# Patient Record
Sex: Female | Born: 1980 | Race: Black or African American | Hispanic: No | Marital: Single | Smoking: Light tobacco smoker
Health system: Southern US, Community
[De-identification: ages and names within clinical notes are randomized; demographics above are authoritative.]

## PROBLEM LIST (undated history)

## (undated) DIAGNOSIS — R42 Dizziness and giddiness: Secondary | ICD-10-CM

## (undated) DIAGNOSIS — F4310 Post-traumatic stress disorder, unspecified: Secondary | ICD-10-CM

## (undated) DIAGNOSIS — I959 Hypotension, unspecified: Secondary | ICD-10-CM

## (undated) DIAGNOSIS — F329 Major depressive disorder, single episode, unspecified: Secondary | ICD-10-CM

## (undated) DIAGNOSIS — F32A Depression, unspecified: Secondary | ICD-10-CM

## (undated) DIAGNOSIS — F419 Anxiety disorder, unspecified: Secondary | ICD-10-CM

## (undated) HISTORY — DX: Major depressive disorder, single episode, unspecified: F32.9

## (undated) HISTORY — DX: Post-traumatic stress disorder, unspecified: F43.10

## (undated) HISTORY — DX: Depression, unspecified: F32.A

## (undated) HISTORY — PX: CARPAL TUNNEL RELEASE: SHX101

## (undated) HISTORY — PX: CHOLECYSTECTOMY: SHX55

## (undated) HISTORY — DX: Anxiety disorder, unspecified: F41.9

## (undated) HISTORY — PX: UTERINE FIBROID SURGERY: SHX826

## (undated) HISTORY — DX: Hypotension, unspecified: I95.9

## (undated) HISTORY — DX: Dizziness and giddiness: R42

---

## 2016-05-06 DIAGNOSIS — G8929 Other chronic pain: Secondary | ICD-10-CM | POA: Diagnosis not present

## 2016-05-06 DIAGNOSIS — Z131 Encounter for screening for diabetes mellitus: Secondary | ICD-10-CM | POA: Diagnosis not present

## 2016-05-06 DIAGNOSIS — Z1389 Encounter for screening for other disorder: Secondary | ICD-10-CM | POA: Diagnosis not present

## 2016-05-06 DIAGNOSIS — Z0001 Encounter for general adult medical examination with abnormal findings: Secondary | ICD-10-CM | POA: Diagnosis not present

## 2016-05-06 DIAGNOSIS — R1013 Epigastric pain: Secondary | ICD-10-CM | POA: Diagnosis not present

## 2016-05-06 DIAGNOSIS — M5090 Cervical disc disorder, unspecified, unspecified cervical region: Secondary | ICD-10-CM | POA: Diagnosis not present

## 2016-05-06 DIAGNOSIS — R42 Dizziness and giddiness: Secondary | ICD-10-CM | POA: Diagnosis not present

## 2016-05-06 DIAGNOSIS — Z13220 Encounter for screening for lipoid disorders: Secondary | ICD-10-CM | POA: Diagnosis not present

## 2016-05-06 DIAGNOSIS — Z130 Encounter for screening for diseases of the blood and blood-forming organs and certain disorders involving the immune mechanism: Secondary | ICD-10-CM | POA: Diagnosis not present

## 2016-05-06 DIAGNOSIS — G5603 Carpal tunnel syndrome, bilateral upper limbs: Secondary | ICD-10-CM | POA: Diagnosis not present

## 2016-05-11 DIAGNOSIS — R42 Dizziness and giddiness: Secondary | ICD-10-CM | POA: Diagnosis not present

## 2016-05-11 DIAGNOSIS — Z0001 Encounter for general adult medical examination with abnormal findings: Secondary | ICD-10-CM | POA: Diagnosis not present

## 2016-05-11 DIAGNOSIS — Z1389 Encounter for screening for other disorder: Secondary | ICD-10-CM | POA: Diagnosis not present

## 2016-05-11 DIAGNOSIS — Z131 Encounter for screening for diabetes mellitus: Secondary | ICD-10-CM | POA: Diagnosis not present

## 2016-05-11 DIAGNOSIS — Z130 Encounter for screening for diseases of the blood and blood-forming organs and certain disorders involving the immune mechanism: Secondary | ICD-10-CM | POA: Diagnosis not present

## 2016-05-11 DIAGNOSIS — Z13220 Encounter for screening for lipoid disorders: Secondary | ICD-10-CM | POA: Diagnosis not present

## 2016-05-13 DIAGNOSIS — R51 Headache: Secondary | ICD-10-CM | POA: Diagnosis not present

## 2016-05-13 DIAGNOSIS — R42 Dizziness and giddiness: Secondary | ICD-10-CM | POA: Diagnosis not present

## 2016-05-13 DIAGNOSIS — R2681 Unsteadiness on feet: Secondary | ICD-10-CM | POA: Diagnosis not present

## 2016-05-13 DIAGNOSIS — Z0110 Encounter for examination of ears and hearing without abnormal findings: Secondary | ICD-10-CM | POA: Diagnosis not present

## 2016-05-18 DIAGNOSIS — M5090 Cervical disc disorder, unspecified, unspecified cervical region: Secondary | ICD-10-CM | POA: Diagnosis not present

## 2016-05-18 DIAGNOSIS — G5603 Carpal tunnel syndrome, bilateral upper limbs: Secondary | ICD-10-CM | POA: Diagnosis not present

## 2016-05-18 DIAGNOSIS — G8929 Other chronic pain: Secondary | ICD-10-CM | POA: Diagnosis not present

## 2016-05-18 DIAGNOSIS — R42 Dizziness and giddiness: Secondary | ICD-10-CM | POA: Diagnosis not present

## 2016-05-18 DIAGNOSIS — R739 Hyperglycemia, unspecified: Secondary | ICD-10-CM | POA: Diagnosis not present

## 2016-05-18 DIAGNOSIS — R1013 Epigastric pain: Secondary | ICD-10-CM | POA: Diagnosis not present

## 2016-05-25 DIAGNOSIS — Z1389 Encounter for screening for other disorder: Secondary | ICD-10-CM | POA: Diagnosis not present

## 2016-05-25 DIAGNOSIS — M5090 Cervical disc disorder, unspecified, unspecified cervical region: Secondary | ICD-10-CM | POA: Diagnosis not present

## 2016-05-25 DIAGNOSIS — G5603 Carpal tunnel syndrome, bilateral upper limbs: Secondary | ICD-10-CM | POA: Diagnosis not present

## 2016-05-25 DIAGNOSIS — R739 Hyperglycemia, unspecified: Secondary | ICD-10-CM | POA: Diagnosis not present

## 2016-05-25 DIAGNOSIS — R1013 Epigastric pain: Secondary | ICD-10-CM | POA: Diagnosis not present

## 2016-05-25 DIAGNOSIS — G8929 Other chronic pain: Secondary | ICD-10-CM | POA: Diagnosis not present

## 2016-05-25 DIAGNOSIS — R42 Dizziness and giddiness: Secondary | ICD-10-CM | POA: Diagnosis not present

## 2016-06-01 DIAGNOSIS — R42 Dizziness and giddiness: Secondary | ICD-10-CM | POA: Diagnosis not present

## 2016-06-01 DIAGNOSIS — G5603 Carpal tunnel syndrome, bilateral upper limbs: Secondary | ICD-10-CM | POA: Diagnosis not present

## 2016-06-01 DIAGNOSIS — M5090 Cervical disc disorder, unspecified, unspecified cervical region: Secondary | ICD-10-CM | POA: Diagnosis not present

## 2016-06-01 DIAGNOSIS — R1013 Epigastric pain: Secondary | ICD-10-CM | POA: Diagnosis not present

## 2016-06-01 DIAGNOSIS — R739 Hyperglycemia, unspecified: Secondary | ICD-10-CM | POA: Diagnosis not present

## 2016-06-01 DIAGNOSIS — G8929 Other chronic pain: Secondary | ICD-10-CM | POA: Diagnosis not present

## 2016-07-21 DIAGNOSIS — R2681 Unsteadiness on feet: Secondary | ICD-10-CM | POA: Diagnosis not present

## 2016-07-21 DIAGNOSIS — R42 Dizziness and giddiness: Secondary | ICD-10-CM | POA: Diagnosis not present

## 2016-07-29 DIAGNOSIS — H8111 Benign paroxysmal vertigo, right ear: Secondary | ICD-10-CM | POA: Diagnosis not present

## 2016-08-13 DIAGNOSIS — R031 Nonspecific low blood-pressure reading: Secondary | ICD-10-CM | POA: Diagnosis not present

## 2016-08-13 DIAGNOSIS — B070 Plantar wart: Secondary | ICD-10-CM | POA: Diagnosis not present

## 2016-08-13 DIAGNOSIS — Z6824 Body mass index (BMI) 24.0-24.9, adult: Secondary | ICD-10-CM | POA: Diagnosis not present

## 2016-08-13 DIAGNOSIS — M79672 Pain in left foot: Secondary | ICD-10-CM | POA: Diagnosis not present

## 2016-08-27 DIAGNOSIS — M79672 Pain in left foot: Secondary | ICD-10-CM | POA: Diagnosis not present

## 2016-08-27 DIAGNOSIS — B078 Other viral warts: Secondary | ICD-10-CM | POA: Diagnosis not present

## 2016-09-10 DIAGNOSIS — B078 Other viral warts: Secondary | ICD-10-CM | POA: Diagnosis not present

## 2016-09-24 DIAGNOSIS — B078 Other viral warts: Secondary | ICD-10-CM | POA: Diagnosis not present

## 2016-09-26 DIAGNOSIS — S01511A Laceration without foreign body of lip, initial encounter: Secondary | ICD-10-CM | POA: Diagnosis not present

## 2016-09-26 DIAGNOSIS — Y999 Unspecified external cause status: Secondary | ICD-10-CM | POA: Diagnosis not present

## 2016-09-26 DIAGNOSIS — R55 Syncope and collapse: Secondary | ICD-10-CM | POA: Diagnosis not present

## 2016-09-26 DIAGNOSIS — S40011A Contusion of right shoulder, initial encounter: Secondary | ICD-10-CM | POA: Diagnosis not present

## 2016-09-30 DIAGNOSIS — R55 Syncope and collapse: Secondary | ICD-10-CM | POA: Diagnosis not present

## 2016-09-30 DIAGNOSIS — Z6824 Body mass index (BMI) 24.0-24.9, adult: Secondary | ICD-10-CM | POA: Diagnosis not present

## 2016-10-08 DIAGNOSIS — B078 Other viral warts: Secondary | ICD-10-CM | POA: Diagnosis not present

## 2016-10-14 DIAGNOSIS — N949 Unspecified condition associated with female genital organs and menstrual cycle: Secondary | ICD-10-CM | POA: Diagnosis not present

## 2016-10-14 DIAGNOSIS — M5090 Cervical disc disorder, unspecified, unspecified cervical region: Secondary | ICD-10-CM | POA: Diagnosis not present

## 2016-10-14 DIAGNOSIS — R739 Hyperglycemia, unspecified: Secondary | ICD-10-CM | POA: Diagnosis not present

## 2016-10-14 DIAGNOSIS — N898 Other specified noninflammatory disorders of vagina: Secondary | ICD-10-CM | POA: Diagnosis not present

## 2016-10-14 DIAGNOSIS — Z7251 High risk heterosexual behavior: Secondary | ICD-10-CM | POA: Diagnosis not present

## 2016-10-14 DIAGNOSIS — Z6824 Body mass index (BMI) 24.0-24.9, adult: Secondary | ICD-10-CM | POA: Diagnosis not present

## 2016-10-14 DIAGNOSIS — R42 Dizziness and giddiness: Secondary | ICD-10-CM | POA: Diagnosis not present

## 2016-10-14 DIAGNOSIS — G5603 Carpal tunnel syndrome, bilateral upper limbs: Secondary | ICD-10-CM | POA: Diagnosis not present

## 2016-10-14 DIAGNOSIS — L298 Other pruritus: Secondary | ICD-10-CM | POA: Diagnosis not present

## 2016-10-14 DIAGNOSIS — M545 Low back pain: Secondary | ICD-10-CM | POA: Diagnosis not present

## 2016-10-22 DIAGNOSIS — B078 Other viral warts: Secondary | ICD-10-CM | POA: Diagnosis not present

## 2016-11-05 DIAGNOSIS — B078 Other viral warts: Secondary | ICD-10-CM | POA: Diagnosis not present

## 2016-11-25 DIAGNOSIS — R55 Syncope and collapse: Secondary | ICD-10-CM | POA: Diagnosis not present

## 2016-11-27 DIAGNOSIS — B078 Other viral warts: Secondary | ICD-10-CM | POA: Diagnosis not present

## 2017-07-08 DIAGNOSIS — L309 Dermatitis, unspecified: Secondary | ICD-10-CM | POA: Diagnosis not present

## 2017-07-08 DIAGNOSIS — F419 Anxiety disorder, unspecified: Secondary | ICD-10-CM | POA: Diagnosis not present

## 2017-07-08 DIAGNOSIS — R55 Syncope and collapse: Secondary | ICD-10-CM | POA: Diagnosis not present

## 2017-07-08 DIAGNOSIS — K219 Gastro-esophageal reflux disease without esophagitis: Secondary | ICD-10-CM | POA: Diagnosis not present

## 2017-07-08 DIAGNOSIS — T753XXA Motion sickness, initial encounter: Secondary | ICD-10-CM | POA: Diagnosis not present

## 2017-07-08 DIAGNOSIS — R42 Dizziness and giddiness: Secondary | ICD-10-CM | POA: Diagnosis not present

## 2017-07-08 DIAGNOSIS — F329 Major depressive disorder, single episode, unspecified: Secondary | ICD-10-CM | POA: Diagnosis not present

## 2017-07-08 DIAGNOSIS — L84 Corns and callosities: Secondary | ICD-10-CM | POA: Diagnosis not present

## 2017-07-08 DIAGNOSIS — Z8249 Family history of ischemic heart disease and other diseases of the circulatory system: Secondary | ICD-10-CM | POA: Diagnosis not present

## 2017-07-08 DIAGNOSIS — G4700 Insomnia, unspecified: Secondary | ICD-10-CM | POA: Diagnosis not present

## 2017-07-22 DIAGNOSIS — L84 Corns and callosities: Secondary | ICD-10-CM | POA: Diagnosis not present

## 2017-07-29 DIAGNOSIS — R42 Dizziness and giddiness: Secondary | ICD-10-CM | POA: Diagnosis not present

## 2017-07-29 DIAGNOSIS — H5509 Other forms of nystagmus: Secondary | ICD-10-CM | POA: Diagnosis not present

## 2017-08-10 DIAGNOSIS — L84 Corns and callosities: Secondary | ICD-10-CM | POA: Diagnosis not present

## 2017-08-10 DIAGNOSIS — F329 Major depressive disorder, single episode, unspecified: Secondary | ICD-10-CM | POA: Diagnosis not present

## 2017-08-10 DIAGNOSIS — Z0000 Encounter for general adult medical examination without abnormal findings: Secondary | ICD-10-CM | POA: Diagnosis not present

## 2017-08-10 DIAGNOSIS — F419 Anxiety disorder, unspecified: Secondary | ICD-10-CM | POA: Diagnosis not present

## 2017-08-10 DIAGNOSIS — L905 Scar conditions and fibrosis of skin: Secondary | ICD-10-CM | POA: Diagnosis not present

## 2017-08-12 DIAGNOSIS — Z79899 Other long term (current) drug therapy: Secondary | ICD-10-CM | POA: Diagnosis not present

## 2017-08-12 DIAGNOSIS — R55 Syncope and collapse: Secondary | ICD-10-CM | POA: Diagnosis not present

## 2017-08-13 DIAGNOSIS — R55 Syncope and collapse: Secondary | ICD-10-CM | POA: Diagnosis not present

## 2017-09-13 DIAGNOSIS — R55 Syncope and collapse: Secondary | ICD-10-CM | POA: Diagnosis not present

## 2017-09-13 DIAGNOSIS — Z9889 Other specified postprocedural states: Secondary | ICD-10-CM | POA: Diagnosis not present

## 2017-09-13 DIAGNOSIS — F418 Other specified anxiety disorders: Secondary | ICD-10-CM | POA: Diagnosis not present

## 2017-09-13 DIAGNOSIS — M79601 Pain in right arm: Secondary | ICD-10-CM | POA: Diagnosis not present

## 2017-09-13 DIAGNOSIS — F4310 Post-traumatic stress disorder, unspecified: Secondary | ICD-10-CM | POA: Diagnosis not present

## 2017-09-13 DIAGNOSIS — R202 Paresthesia of skin: Secondary | ICD-10-CM | POA: Diagnosis not present

## 2017-09-13 DIAGNOSIS — H8110 Benign paroxysmal vertigo, unspecified ear: Secondary | ICD-10-CM | POA: Diagnosis not present

## 2017-09-13 DIAGNOSIS — Z720 Tobacco use: Secondary | ICD-10-CM | POA: Diagnosis not present

## 2017-09-13 DIAGNOSIS — M79602 Pain in left arm: Secondary | ICD-10-CM | POA: Diagnosis not present

## 2017-09-20 DIAGNOSIS — R55 Syncope and collapse: Secondary | ICD-10-CM | POA: Diagnosis not present

## 2017-10-13 DIAGNOSIS — R202 Paresthesia of skin: Secondary | ICD-10-CM | POA: Diagnosis not present

## 2017-10-13 DIAGNOSIS — R55 Syncope and collapse: Secondary | ICD-10-CM | POA: Diagnosis not present

## 2017-10-18 DIAGNOSIS — R55 Syncope and collapse: Secondary | ICD-10-CM | POA: Diagnosis not present

## 2018-03-03 ENCOUNTER — Ambulatory Visit: Payer: Self-pay | Admitting: Family Medicine

## 2018-03-08 ENCOUNTER — Ambulatory Visit (INDEPENDENT_AMBULATORY_CARE_PROVIDER_SITE_OTHER): Payer: Medicare Other | Admitting: Family Medicine

## 2018-03-08 ENCOUNTER — Encounter: Payer: Self-pay | Admitting: Family Medicine

## 2018-03-08 ENCOUNTER — Other Ambulatory Visit: Payer: Self-pay

## 2018-03-08 VITALS — BP 131/66 | HR 79 | Temp 98.0°F | Resp 17 | Ht 64.0 in | Wt 155.8 lb

## 2018-03-08 DIAGNOSIS — Z0001 Encounter for general adult medical examination with abnormal findings: Secondary | ICD-10-CM | POA: Diagnosis not present

## 2018-03-08 DIAGNOSIS — Z13220 Encounter for screening for lipoid disorders: Secondary | ICD-10-CM

## 2018-03-08 DIAGNOSIS — Z7689 Persons encountering health services in other specified circumstances: Secondary | ICD-10-CM

## 2018-03-08 DIAGNOSIS — R7989 Other specified abnormal findings of blood chemistry: Secondary | ICD-10-CM | POA: Diagnosis not present

## 2018-03-08 DIAGNOSIS — Z1329 Encounter for screening for other suspected endocrine disorder: Secondary | ICD-10-CM | POA: Diagnosis not present

## 2018-03-08 DIAGNOSIS — G4730 Sleep apnea, unspecified: Secondary | ICD-10-CM

## 2018-03-08 DIAGNOSIS — Z87898 Personal history of other specified conditions: Secondary | ICD-10-CM

## 2018-03-08 DIAGNOSIS — L905 Scar conditions and fibrosis of skin: Secondary | ICD-10-CM

## 2018-03-08 DIAGNOSIS — Z131 Encounter for screening for diabetes mellitus: Secondary | ICD-10-CM | POA: Diagnosis not present

## 2018-03-08 DIAGNOSIS — Z130 Encounter for screening for diseases of the blood and blood-forming organs and certain disorders involving the immune mechanism: Secondary | ICD-10-CM

## 2018-03-08 MED ORDER — TRIAMCINOLONE ACETONIDE 0.1 % EX CREA: 1.0000 "application " | TOPICAL_CREAM | Freq: Two times a day (BID) | CUTANEOUS | 1 refills | Status: DC

## 2018-03-08 MED ORDER — MELOXICAM 15 MG PO TABS: 15.0000 mg | ORAL_TABLET | Freq: Every day | ORAL | 1 refills | Status: DC

## 2018-03-08 NOTE — Patient Instructions (Addendum)
Thank you for choosing Primary Care at [facility] to be your medical home!    Diana Townsend was seen by Joaquin Courts, FNP today.   Diana Townsend's primary care provider is Bing Neighbors, FNP.   For the best care possible, you should try to see Joaquin Courts, FNP-C whenever you come to the clinic.   We look forward to seeing you again soon!  If you have any questions about your visit today, please call us at [phone] or feel free to reach your primary care provider via MyChart.       Sleep Studies A sleep study (polysomnogram) is a series of tests done while you are sleeping. It can show how well you sleep. This can help your health care provider diagnose a sleep disorder and show how severe your sleep disorder is. A sleep study may lead to treatment that will help you sleep better and prevent other medical problems caused by poor sleep. If you have a sleep disorder, you may also be at risk for:  Sleep-related accidents.  High blood pressure.  Heart disease.  Stroke.  Other medical conditions.  Sleep disorders are common. Your health care provider may suspect a sleep disorder if you:  Have loud snoring most nights.  Have brief periods when you stop breathing at night.  Feel sleepy on most days.  Fall asleep suddenly during the day.  Have trouble falling asleep or staying asleep.  Feel like you need to move your legs when trying to fall asleep.  Have dreams that seem very real shortly after falling asleep.  Feel like you cannot move when you first wake up.  Which tests will I need to have? Most sleep studies last all night and include these tests:  Recordings of your brain activity.  Recordings of your eye movements.  Recording of your heart rate and rhythm.  Blood pressure readings.  Readings of the amount of oxygen in your blood.  Measurements of your chest and belly movement as you breathe during sleep.  If you have signs of the sleep  disorder called sleep apnea during your test, you may get a mask to wear for the second half of the night.  The mask provides continuous positive airway pressure (CPAP). This may improve sleep apnea significantly.  You will then have all tests done again with the mask in place to see if your measurements and recordings change.  How are sleep studies done? Most sleep studies are done over one full night of sleep.  You will arrive at the study center in the evening and can go home in the morning.  Bring your pajamas and toothbrush.  Do not have caffeine on the day of your sleep study.  Your health care provider will let you know if you need to stop taking any of your regular medicines before the test.  To do the tests included in a polysomnogram, you will have:  Round, sticky patches with sensors attached to recording wires (electrodes) placed on your scalp, face, chest, and limbs.  Wires from all the electrodes and sensors run from your bed to a computer. The wires can be taken off and put back on if you need to get out of bed to go to the bathroom.  A sensor placed over your nose to measure airflow.  A finger clip put on one finger to measure your blood oxygen level.  A belt around your belly and a belt around your chest to measure breathing movements.  Where are sleep studies done? Sleep studies are done at sleep centers. A sleep center may be inside a hospital, office, or clinic. The room where you have the study may look like a hospital room or a hotel room. The health care providers doing the study may come in and out of the room during the study. Most of the time, they will be in another room monitoring your test. How is information from sleep studies helpful? A polysomnogram can be used along with your medical history and a physical exam to diagnose conditions, such as:  Sleep apnea.  Restless legs syndrome.  Sleep-related seizure disorders.  Sleep-related movement  disorders.  A medical doctor who specializes in sleep will evaluate your sleep study. The specialist will share the results with your primary health care provider. Treatments based on your sleep study may include:  Improving your sleep habits (sleep hygiene).  Wearing a CPAP mask.  Wearing an oral device at night to improve breathing and reduce snoring.  Taking medicine for: ? Restless legs syndrome. ? Sleep-related seizure disorder. ? Sleep-related movement disorder.  This information is not intended to replace advice given to you by your health care provider. Make sure you discuss any questions you have with your health care provider. Document Released: 10/25/2002 Document Revised: 12/15/2015 Document Reviewed: 06/26/2013 Elsevier Interactive Patient Education  [employer].

## 2018-03-08 NOTE — Progress Notes (Signed)
Diana Townsend, is a 37 y.o. female  ZOX:[id number]  [id number]:[id number]  DOB - March 14, 1981  CC:  Chief Complaint  Patient presents with  . Establish Care  . Referral    needs sleep study done & referral to dermatology(irritation of scar)       HPI: Diana Townsend is a 37 y.o. female is here today to establish care.   Aissata Failla does not have a problem list on file.    Today's visit:  Patient presents today to establish care. She also reports that she is requesting a sleep study. She has had multiple episodes of syncopal-like events in which she has passed out for no known reason.  She has undergone a comprehensive cardiovascular work-up which was negative of any findings related to her passing out.  She had a echo as recent as 02/17/2018 which showed a normal ejection fraction and normal functioning of the heart. She reports no prior history of obstructive sleep apnea however reports daytime sleepiness and she has been told before that she has stopped breathing during sleep.  She is on light smoker and has a current BMI of Body mass index is 26.74 kg/m.  She has had some elevations in blood pressure although no diagnosis of hypertension.  Overdue health maintenance includes: Pap, Tdap, Influenza She reports no routine physical activity and does not adhere to any restrictive dietary measures.   Patient denies new headaches, chest pain, abdominal pain, nausea, new weakness, numbness or tingling, SOB, edema, or worrisome cough..    Current medications:No current outpatient medications on file.   Pertinent family medical history: family history includes Anemia in her sister; COPD in her father; Diabetes in her father, maternal aunt, and mother; Heart disease in her maternal grandfather, mother, and paternal grandmother; Heart failure in her father; Hypertension in her father; Kidney disease in her father; Seizures in her father and sister.   Allergies not on file  Social History    Socioeconomic History  . Marital status: Single    Spouse name: Not on file  . Number of children: Not on file  . Years of education: Not on file  . Highest education level: Not on file  Occupational History  . Not on file  Social Needs  . Financial resource strain: Not on file  . Food insecurity:    Worry: Not on file    Inability: Not on file  . Transportation needs:    Medical: Not on file    Non-medical: Not on file  Tobacco Use  . Smoking status: Light Tobacco Smoker    Types: Cigars  . Smokeless tobacco: Never Used  Substance and Sexual Activity  . Alcohol use: Not Currently  . Drug use: Never  . Sexual activity: Not Currently  Lifestyle  . Physical activity:    Days per week: Not on file    Minutes per session: Not on file  . Stress: Not on file  Relationships  . Social connections:    Talks on phone: Not on file    Gets together: Not on file    Attends religious service: Not on file    Active member of club or organization: Not on file    Attends meetings of clubs or organizations: Not on file    Relationship status: Not on file  . Intimate partner violence:    Fear of current or ex partner: Not on file    Emotionally abused: Not on file    Physically abused: Not on file  Forced sexual activity: Not on file  Other Topics Concern  . Not on file  Social History Narrative  . Not on file    Review of Systems: Constitutional: Negative for fever, chills, diaphoresis, activity change, appetite change and fatigue. HENT: Negative for ear pain, nosebleeds, congestion, facial swelling, rhinorrhea, neck pain, neck stiffness and ear discharge.  Eyes: Negative for pain, discharge, redness, itching and visual disturbance. Respiratory: Negative for cough, choking, chest tightness, shortness of breath, wheezing and stridor.  Cardiovascular: Negative for chest pain, palpitations and leg swelling. Musculoskeletal: Negative for back pain, joint swelling, arthralgia and  gait problem. Neurological: Negative for dizziness, tremors, seizures, syncope, facial asymmetry, speech difficulty, weakness, light-headedness, numbness and headaches.  Psychiatric/Behavioral: Negative for hallucinations, behavioral problems, confusion, dysphoric mood, decreased concentration and agitation.    Objective:   Vitals:   03/08/18 1055  BP: 131/66  Pulse: 79  Resp: 17  Temp: 98 F (36.7 C)  SpO2: 97%    BP Readings from Last 3 Encounters:  03/08/18 131/66    Filed Weights   03/08/18 1055  Weight: 155 lb 12.8 oz (70.7 kg)      Physical Exam: Constitutional: Patient appears well-developed and well-nourished. No distress. HENT: Normocephalic, atraumatic, External right and left ear normal.  Eyes: Conjunctivae and EOM are normal. PERRLA, no scleral icterus. Neck: Normal ROM. Neck supple. No JVD. No tracheal deviation. No thyromegaly. CVS: RRR, S1/S2 +, no murmurs, no gallops, no carotid bruit.  Pulmonary: Effort and breath sounds normal, no stridor, rhonchi, wheezes, rales.  Abdominal: Soft. BS +, no distension, tenderness, rebound or guarding.  Musculoskeletal: Normal range of motion. No edema and no tenderness.  Neuro: Alert. Normal muscle tone coordination. Normal gait. BUE and BLE strength 5/5. Bilateral hand grips symmetrical.No cranial nerve deficit. Skin: Skin is warm and dry. No rash noted. Not diaphoretic. No erythema. No pallor. Psychiatric: Normal mood and affect. Behavior, judgment, thought content normal.     Assessment and plan:  1. Encounter to establish care 2. Screening for lipid disorders - Lipid panel 3. Low vitamin D level - Vitamin D, 25-hydroxy  4. Screening for diabetes mellitus - Comprehensive metabolic panel - Hemoglobin A1c  5. Screening, lipid -fasting lipid panel pending  6. Thyroid disorder screen - Thyroid Panel With TSH  7. Screening for deficiency anemia - CBC with Differential  8. Skin scarring/fibrosis, will trial  triamcinolone cream while patient is worked into dermatology to reduce chronic skin picking and itching. - Ambulatory referral to Dermatology  9. Sleep apnea, unspecified type -Epworth sleepiness scale was within normal range.  Given patient's history of multiple syncopal episodes and her reported history of apneic episodes during nighttime sleep, patient may likely benefit from a sleep study to rule out OSA. - Split night study; Future  10. Hx of syncope, with a normal cardiac work-up. Order placed split night study; Future  Meds ordered this encounter  Medications  . triamcinolone cream (KENALOG) 0.1 %    Sig: Apply 1 application topically 2 (two) times daily.    Dispense:  454 g    Refill:  1  . meloxicam (MOBIC) 15 MG tablet    Sig: Take 1 tablet (15 mg total) by mouth daily.    Dispense:  30 tablet    Refill:  1    Orders Placed This Encounter  Procedures  . Lipid panel    Order Specific Question:   Has the patient fasted?    Answer:   No  . Comprehensive  metabolic panel    Order Specific Question:   Has the patient fasted?    Answer:   No  . Vitamin D, 25-hydroxy  . Hemoglobin A1c  . CBC with Differential  . Thyroid Panel With TSH  . Ambulatory referral to Dermatology    Referral Priority:   Routine    Referral Type:   Consultation    Referral Reason:   Specialty Services Required    Requested Specialty:   Dermatology    Number of Visits Requested:   1  . Split night study    Standing Status:   Future    Standing Expiration Date:   03/09/2019    Order Specific Question:   Where should this test be performed:    Answer:   [facility]   Return in about 2 months (around 05/13/2018), or PAP .   The patient was given clear instructions to go to ER or return to medical center if symptoms don't improve, worsen or new problems develop. The patient verbalized understanding. The patient was advised  to call and obtain lab results if they haven't heard  anything from out office within 7-10 business days.  Joaquin Courts, FNP Primary Care at [facility] [address] [place] [address] [phone]: [phone]    This note has been created with Dragon speech recognition software and Paediatric nurse. Any transcriptional errors are unintentional.

## 2018-03-09 ENCOUNTER — Telehealth: Payer: Self-pay | Admitting: Family Medicine

## 2018-03-09 DIAGNOSIS — E875 Hyperkalemia: Secondary | ICD-10-CM

## 2018-03-09 LAB — CBC WITH DIFFERENTIAL/PLATELET
BASOS ABS: 0 10*3/uL (ref 0.0–0.2)
Basos: 1 %
EOS (ABSOLUTE): 0.1 10*3/uL (ref 0.0–0.4)
Eos: 2 %
Hematocrit: 40.9 % (ref 34.0–46.6)
Hemoglobin: 13.8 g/dL (ref 11.1–15.9)
IMMATURE GRANS (ABS): 0 10*3/uL (ref 0.0–0.1)
Immature Granulocytes: 0 %
LYMPHS: 32 %
Lymphocytes Absolute: 2 10*3/uL (ref 0.7–3.1)
MCH: 26.7 pg (ref 26.6–33.0)
MCHC: 33.7 g/dL (ref 31.5–35.7)
MCV: 79 fL (ref 79–97)
Monocytes Absolute: 0.4 10*3/uL (ref 0.1–0.9)
Monocytes: 6 %
NEUTROS ABS: 3.7 10*3/uL (ref 1.4–7.0)
Neutrophils: 59 %
PLATELETS: 344 10*3/uL (ref 150–450)
RBC: 5.17 x10E6/uL (ref 3.77–5.28)
RDW: 13.8 % (ref 12.3–15.4)
WBC: 6.3 10*3/uL (ref 3.4–10.8)

## 2018-03-09 LAB — COMPREHENSIVE METABOLIC PANEL
ALBUMIN: 4.5 g/dL (ref 3.5–5.5)
ALK PHOS: 86 IU/L (ref 39–117)
ALT: 11 IU/L (ref 0–32)
AST: 29 IU/L (ref 0–40)
Albumin/Globulin Ratio: 1.7 (ref 1.2–2.2)
BILIRUBIN TOTAL: 0.5 mg/dL (ref 0.0–1.2)
BUN / CREAT RATIO: 14 (ref 9–23)
BUN: 10 mg/dL (ref 6–20)
CHLORIDE: 104 mmol/L (ref 96–106)
CO2: 17 mmol/L — ABNORMAL LOW (ref 20–29)
Calcium: 9.1 mg/dL (ref 8.7–10.2)
Creatinine, Ser: 0.72 mg/dL (ref 0.57–1.00)
GFR calc Af Amer: 122 mL/min/{1.73_m2} (ref >59)
GFR calc non Af Amer: 106 mL/min/{1.73_m2} (ref >59)
GLOBULIN, TOTAL: 2.6 g/dL (ref 1.5–4.5)
Glucose: 80 mg/dL (ref 65–99)
POTASSIUM: 5.7 mmol/L — AB (ref 3.5–5.2)
SODIUM: 143 mmol/L (ref 134–144)
Total Protein: 7.1 g/dL (ref 6.0–8.5)

## 2018-03-09 LAB — LIPID PANEL
CHOLESTEROL TOTAL: 248 mg/dL — AB (ref 100–199)
Chol/HDL Ratio: 4.7 ratio — ABNORMAL HIGH (ref 0.0–4.4)
HDL: 53 mg/dL (ref >39)
LDL Calculated: 169 mg/dL — ABNORMAL HIGH (ref 0–99)
Triglycerides: 130 mg/dL (ref 0–149)
VLDL CHOLESTEROL CAL: 26 mg/dL (ref 5–40)

## 2018-03-09 LAB — THYROID PANEL WITH TSH
FREE THYROXINE INDEX: 2.5 (ref 1.2–4.9)
T3 Uptake Ratio: 28 % (ref 24–39)
T4 TOTAL: 9.1 ug/dL (ref 4.5–12.0)
TSH: 1.49 u[IU]/mL (ref 0.450–4.500)

## 2018-03-09 LAB — HEMOGLOBIN A1C
Est. average glucose Bld gHb Est-mCnc: 111 mg/dL
Hgb A1c MFr Bld: 5.5 % (ref 4.8–5.6)

## 2018-03-09 LAB — VITAMIN D 25 HYDROXY (VIT D DEFICIENCY, FRACTURES): VIT D 25 HYDROXY: 24.4 ng/mL — AB (ref 30.0–100.0)

## 2018-03-09 MED ORDER — VITAMIN D (ERGOCALCIFEROL) 1.25 MG (50000 UNIT) PO CAPS: 50000.0000 [IU] | ORAL_CAPSULE | ORAL | 1 refills | Status: DC

## 2018-03-09 NOTE — Telephone Encounter (Signed)
Contact patient to advise of the following:  -potassium level was elevated. Would like for her to return to have potassium level recheck. If level remains elevated I will need to start her on lasix. If she could come in Friday afternoon during the nurse schedule that would be great  -lipid panel is abnormal. I would recommend statin therapy given her history of syncope and family history of heart disease. I will start atorvastatin 20 mg if patient is in agreement with plan.  Joaquin Courts, FNP Primary Care at [facility] [address] [place] [address] [phone]: [phone]

## 2018-03-09 NOTE — Telephone Encounter (Signed)
Vitamin D level is low. I am sending over a prescription for once weekly, 50,000 units vitamin D replacement.

## 2018-03-10 ENCOUNTER — Encounter: Payer: Self-pay | Admitting: Family Medicine

## 2018-03-10 MED ORDER — ATORVASTATIN CALCIUM 20 MG PO TABS: 20.0000 mg | ORAL_TABLET | Freq: Every day | ORAL | 3 refills | Status: DC

## 2018-03-10 NOTE — Telephone Encounter (Signed)
Patient notified of lab results & recommendations. Is agreeable to starting statin.  Made lab appt for 3 pm on 03/11/18

## 2018-03-10 NOTE — Telephone Encounter (Signed)
Atorvastatin 20 mg sent to [facility]

## 2018-03-10 NOTE — Addendum Note (Signed)
Addended by: Bing Neighbors on: 03/10/2018 05:06 PM   Modules accepted: Orders

## 2018-03-11 ENCOUNTER — Other Ambulatory Visit: Payer: Medicare Other

## 2018-03-11 DIAGNOSIS — E875 Hyperkalemia: Secondary | ICD-10-CM

## 2018-03-12 LAB — POTASSIUM: Potassium: 4.5 mmol/L (ref 3.5–5.2)

## 2018-03-13 ENCOUNTER — Telehealth: Payer: Self-pay | Admitting: Family Medicine

## 2018-03-13 NOTE — Telephone Encounter (Signed)
Please notify patient that potassium is normal. No treatment indicated

## 2018-03-14 NOTE — Telephone Encounter (Signed)
Patient notified of lab results. Expressed understanding. Wants to know if there are certain foods that she needs to avoid to prevent potassium from being high again.

## 2018-05-05 ENCOUNTER — Ambulatory Visit (HOSPITAL_BASED_OUTPATIENT_CLINIC_OR_DEPARTMENT_OTHER): Payer: Medicare Other | Attending: Family Medicine | Admitting: Internal Medicine

## 2018-05-05 DIAGNOSIS — Z87898 Personal history of other specified conditions: Secondary | ICD-10-CM | POA: Diagnosis not present

## 2018-05-05 DIAGNOSIS — G4700 Insomnia, unspecified: Secondary | ICD-10-CM | POA: Insufficient documentation

## 2018-05-05 DIAGNOSIS — G4730 Sleep apnea, unspecified: Secondary | ICD-10-CM | POA: Insufficient documentation

## 2018-05-07 DIAGNOSIS — G4730 Sleep apnea, unspecified: Secondary | ICD-10-CM

## 2018-05-07 NOTE — Procedures (Signed)
    Patient Name: Diana Townsend, Diana Townsend Date: 05/05/2018 Gender: Female D.O.B: February 20, 1981 Age (years): 37 Referring Provider: Joaquin Courts FNP Height (inches): 64 Interpreting Physician: Jetty Duhamel MD, ABSM Weight (lbs): 155 RPSGT: Celene Kras BMI: 27 MRN: [id number] Neck Size: 13.00  CLINICAL INFORMATION Sleep Study Type: NPSG Indication for sleep study: OSA Epworth Sleepiness Score: 6  SLEEP STUDY TECHNIQUE As per the AASM Manual for the Scoring of Sleep and Associated Events v2.3 (April 2016) with a hypopnea requiring 4% desaturations.  The channels recorded and monitored were frontal, central and occipital EEG, electrooculogram (EOG), submentalis EMG (chin), nasal and oral airflow, thoracic and abdominal wall motion, anterior tibialis EMG, snore microphone, electrocardiogram, and pulse oximetry.  MEDICATIONS Medications self-administered by patient taken the night of the study : none reported  SLEEP ARCHITECTURE The study was initiated at 10:27:32 PM and ended at 4:32:49 AM.  Sleep onset time was 29.2 minutes and the sleep efficiency was 35.3%%. The total sleep time was 129.1 minutes.  Stage REM latency was N/A minutes.  The patient spent 7.0%% of the night in stage N1 sleep, 87.6%% in stage N2 sleep, 5.4%% in stage N3 and 0% in REM.  Alpha intrusion was absent.  Supine sleep was 16.27%.  RESPIRATORY PARAMETERS The overall apnea/hypopnea index (AHI) was 0.0 per hour. There were 0 total apneas, including 0 obstructive, 0 central and 0 mixed apneas. There were 0 hypopneas and 0 RERAs.  The AHI during Stage REM sleep was N/A per hour.  AHI while supine was 0.0 per hour.  The mean oxygen saturation was 97.6%. The minimum SpO2 during sleep was 96.0%.  snoring was noted during this study.  CARDIAC DATA The 2 lead EKG demonstrated sinus rhythm. The mean heart rate was 72.7 beats per minute. Other EKG findings include: None.  LEG MOVEMENT DATA The total  PLMS were 0 with a resulting PLMS index of 0.0. Associated arousal with leg movement index was 0.5 .  IMPRESSIONS - No significant obstructive sleep apnea occurred during this study (AHI = 0.0/h). - No significant central sleep apnea occurred during this study (CAI = 0.0/h). - The patient had minimal or no oxygen desaturation during the study (Min O2 = 96.0%) - No snoring was audible during this study. - No cardiac abnormalities were noted during this study. - Clinically significant periodic limb movements did not occur during sleep. No significant associated arousals. - Significant difficulty maintaining sleep. Total sleep time 129 minutes. Patient says this happens at home as well.  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 Recognize that limited sleep time might warrant restudy in the future, perhaps with a sleep-aid, if appropriate. - Patient reported that cough disturbs sleep at home. Consider potential factors, including reflux, that might cause cough at night. - Sleep hygiene should be reviewed to assess factors that may improve sleep quality. - Weight management and regular exercise should be initiated or continued if appropriate.  [Electronically signed] 05/07/2018 12:24 PM  Jetty Duhamel MD, ABSM Diplomate, American Board of Sleep Medicine   NPI: [id number]                         Jetty Duhamel Diplomate, American Board of Sleep Medicine  ELECTRONICALLY SIGNED ON:  05/07/2018, 12:19 PM [facility] PH: [phone]   FX: [phone] ACCREDITED BY THE AMERICAN ACADEMY OF SLEEP MEDICINE

## 2018-05-09 ENCOUNTER — Ambulatory Visit: Payer: Self-pay | Admitting: Family Medicine

## 2018-05-11 ENCOUNTER — Encounter: Payer: Self-pay | Admitting: Family Medicine

## 2018-05-11 NOTE — Progress Notes (Signed)
Mail letter. 

## 2018-05-19 ENCOUNTER — Ambulatory Visit (INDEPENDENT_AMBULATORY_CARE_PROVIDER_SITE_OTHER): Payer: Medicare Other | Admitting: Family Medicine

## 2018-05-19 ENCOUNTER — Encounter: Payer: Self-pay | Admitting: Family Medicine

## 2018-05-19 VITALS — BP 118/76 | HR 87 | Temp 97.4°F | Resp 17 | Ht 64.0 in | Wt 157.0 lb

## 2018-05-19 DIAGNOSIS — J018 Other acute sinusitis: Secondary | ICD-10-CM

## 2018-05-19 DIAGNOSIS — H66001 Acute suppurative otitis media without spontaneous rupture of ear drum, right ear: Secondary | ICD-10-CM | POA: Diagnosis not present

## 2018-05-19 DIAGNOSIS — E559 Vitamin D deficiency, unspecified: Secondary | ICD-10-CM | POA: Diagnosis not present

## 2018-05-19 MED ORDER — AZITHROMYCIN 250 MG PO TABS: ORAL_TABLET | ORAL | 0 refills | Status: DC

## 2018-05-19 MED ORDER — IPRATROPIUM BROMIDE 0.03 % NA SOLN: 2.0000 | Freq: Three times a day (TID) | NASAL | 0 refills | Status: DC

## 2018-05-19 MED ORDER — BENZONATATE 100 MG PO CAPS: 100.0000 mg | ORAL_CAPSULE | Freq: Three times a day (TID) | ORAL | 0 refills | Status: DC | PRN

## 2018-05-19 NOTE — Patient Instructions (Signed)
Otitis Media, Adult  Otitis media means that the middle ear is red and swollen (inflamed) and full of fluid. The condition usually goes away on its own. Follow these instructions at home:  Take over-the-counter and prescription medicines only as told by your doctor.  If you were prescribed an antibiotic medicine, take it as told by your doctor. Do not stop taking the antibiotic even if you start to feel better.  Keep all follow-up visits as told by your doctor. This is important. Contact a doctor if:  You have bleeding from your nose.  There is a lump on your neck.  You are not getting better in 5 days.  You feel worse instead of better. Get help right away if:  You have pain that is not helped with medicine.  You have swelling, redness, or pain around your ear.  You get a stiff neck.  You cannot move part of your face (paralyzed).  You notice that the bone behind your ear hurts when you touch it.  You get a very bad headache. Summary  Otitis media means that the middle ear is red, swollen, and full of fluid.  This condition usually goes away on its own. In some cases, treatment may be needed.  If you were prescribed an antibiotic medicine, take it as told by your doctor. This information is not intended to replace advice given to you by your health care provider. Make sure you discuss any questions you have with your health care provider. Document Released: 10/07/2007 Document Revised: 05/11/2016 Document Reviewed: 05/11/2016 Elsevier Interactive Patient Education  2019 [employer] Sinusitis, Adult Sinusitis is soreness and swelling (inflammation) of your sinuses. Sinuses are hollow spaces in the bones around your face. They are located:  Around your eyes.  In the middle of your forehead.  Behind your nose.  In your cheekbones. Your sinuses and nasal passages are lined with a fluid called mucus. Mucus drains out of your sinuses. Swelling can trap mucus in your  sinuses. This lets germs (bacteria, virus, or fungus) grow, which leads to infection. Most of the time, this condition is caused by a virus. What are the causes? This condition is caused by:  Allergies.  Asthma.  Germs.  Things that block your nose or sinuses.  Growths in the nose (nasal polyps).  Chemicals or irritants in the air.  Fungus (rare). What increases the risk? You are more likely to develop this condition if:  You have a weak body defense system (immune system).  You do a lot of swimming or diving.  You use nasal sprays too much.  You smoke. What are the signs or symptoms? The main symptoms of this condition are pain and a feeling of pressure around the sinuses. Other symptoms include:  Stuffy nose (congestion).  Runny nose (drainage).  Swelling and warmth in the sinuses.  Headache.  Toothache.  A cough that may get worse at night.  Mucus that collects in the throat or the back of the nose (postnasal drip).  Being unable to smell and taste.  Being very tired (fatigue).  A fever.  Sore throat.  Bad breath. How is this diagnosed? This condition is diagnosed based on:  Your symptoms.  Your medical history.  A physical exam.  Tests to find out if your condition is short-term (acute) or long-term (chronic). Your doctor may: ? Check your nose for growths (polyps). ? Check your sinuses using a tool that has a light (endoscope). ? Check for allergies or   germs. ? Do imaging tests, such as an MRI or CT scan. How is this treated? Treatment for this condition depends on the cause and whether it is short-term or long-term.  If caused by a virus, your symptoms should go away on their own within 10 days. You may be given medicines to relieve symptoms. They include: ? Medicines that shrink swollen tissue in the nose. ? Medicines that treat allergies (antihistamines). ? A spray that treats swelling of the nostrils. ? Rinses that help get rid of  thick mucus in your nose (nasal saline washes).  If caused by bacteria, your doctor may wait to see if you will get better without treatment. You may be given antibiotic medicine if you have: ? A very bad infection. ? A weak body defense system.  If caused by growths in the nose, you may need to have surgery. Follow these instructions at home: Medicines  Take, use, or apply over-the-counter and prescription medicines only as told by your doctor. These may include nasal sprays.  If you were prescribed an antibiotic medicine, take it as told by your doctor. Do not stop taking the antibiotic even if you start to feel better. Hydrate and humidify   Drink enough water to keep your pee (urine) pale yellow.  Use a cool mist humidifier to keep the humidity level in your home above 50%.  Breathe in steam for 10-15 minutes, 3-4 times a day, or as told by your doctor. You can do this in the bathroom while a hot shower is running.  Try not to spend time in cool or dry air. Rest  Rest as much as you can.  Sleep with your head raised (elevated).  Make sure you get enough sleep each night. General instructions   Put a warm, moist washcloth on your face 3-4 times a day, or as often as told by your doctor. This will help with discomfort.  Wash your hands often with soap and water. If there is no soap and water, use hand sanitizer.  Do not smoke. Avoid being around people who are smoking (secondhand smoke).  Keep all follow-up visits as told by your doctor. This is important. Contact a doctor if:  You have a fever.  Your symptoms get worse.  Your symptoms do not get better within 10 days. Get help right away if:  You have a very bad headache.  You cannot stop throwing up (vomiting).  You have very bad pain or swelling around your face or eyes.  You have trouble seeing.  You feel confused.  Your neck is stiff.  You have trouble breathing. Summary  Sinusitis is swelling of  your sinuses. Sinuses are hollow spaces in the bones around your face.  This condition is caused by tissues in your nose that become inflamed or swollen. This traps germs. These can lead to infection.  If you were prescribed an antibiotic medicine, take it as told by your doctor. Do not stop taking it even if you start to feel better.  Keep all follow-up visits as told by your doctor. This is important. This information is not intended to replace advice given to you by your health care provider. Make sure you discuss any questions you have with your health care provider. Document Released: 10/07/2007 Document Revised: 09/20/2017 Document Reviewed: 09/20/2017 Elsevier Interactive Patient Education  2019 [employer]  

## 2018-05-19 NOTE — Progress Notes (Signed)
Acute Office Visit  Subjective:    Patient ID: Diana Townsend, female    DOB: 02/11/1981, 38 y.o.   MRN: [id number]  Chief Complaint  Patient presents with  . URI    productive cough, postnasal drainage, nasal congestion, sneezing x 1 week. has been taking cough syrup & theraflu with some relief.  . Ear Fullness    B(R>L)    HPI  URI w/ear pain Patient reports greater than a 1 week history of cough which is occasionally productive, postnasal drainage, bilateral ear pain, nasal congestion, sneezing.  She reports that the cough is gradually getting worse.  She also had a brief period of a sore throat which is currently resolved.  She reports over the last couple days ear pressure has turned into ear pain right greater than left.  She has attempted relief with over-the-counter cough and cold preparations along with throat lozenges with mild relief of cough and congestion.  She denies any wheezing, shortness of breath, chest tightness, or fever. She is a chronic daily smoker.  Vitamin D deficiency  Last vitamin d 24.4.  She is down to 2 tablets and is requesting a refill today of vitamin D supplements.   Recheck in a vitamin D level today. Past Medical History:  Diagnosis Date  . Anxiety   . Depression   . Low blood pressure   . PTSD (post-traumatic stress disorder)   . Vertigo     Family History  Problem Relation Age of Onset  . Diabetes Mother   . Heart disease Mother   . COPD Father   . Diabetes Father   . Kidney disease Father   . Hypertension Father   . Heart failure Father   . Seizures Father   . Seizures Sister   . Anemia Sister   . Diabetes Maternal Aunt   . Heart disease Maternal Grandfather   . Heart disease Paternal Grandmother     Social History   Socioeconomic History  . Marital status: Single    Spouse name: Not on file  . Number of children: Not on file  . Years of education: Not on file  . Highest education level: Not on file  Occupational History  .  Not on file  Social Needs  . Financial resource strain: Not on file  . Food insecurity:    Worry: Not on file    Inability: Not on file  . Transportation needs:    Medical: Not on file    Non-medical: Not on file  Tobacco Use  . Smoking status: Light Tobacco Smoker    Types: Cigars  . Smokeless tobacco: Never Used  Substance and Sexual Activity  . Alcohol use: Not Currently  . Drug use: Never  . Sexual activity: Not Currently  Lifestyle  . Physical activity:    Days per week: Not on file    Minutes per session: Not on file  . Stress: Not on file  Relationships  . Social connections:    Talks on phone: Not on file    Gets together: Not on file    Attends religious service: Not on file    Active member of club or organization: Not on file    Attends meetings of clubs or organizations: Not on file    Relationship status: Not on file  . Intimate partner violence:    Fear of current or ex partner: Not on file    Emotionally abused: Not on file    Physically abused: Not on  file    Forced sexual activity: Not on file  Other Topics Concern  . Not on file  Social History Narrative  . Not on file    Outpatient Medications Prior to Visit  Medication Sig Dispense Refill  . atorvastatin (LIPITOR) 20 MG tablet Take 1 tablet (20 mg total) by mouth daily. 90 tablet 3  . meloxicam (MOBIC) 15 MG tablet Take 1 tablet (15 mg total) by mouth daily. 30 tablet 1  . triamcinolone cream (KENALOG) 0.1 % Apply 1 application topically 2 (two) times daily. 454 g 1  . Vitamin D, Ergocalciferol, (DRISDOL) 1.25 MG (50000 UT) CAPS capsule Take 1 capsule (50,000 Units total) by mouth every 7 (seven) days. 12 capsule 1   No facility-administered medications prior to visit.     No Known Allergies  ROS Pertinent negatives listed in HPI     Objective:    BP 118/76   Pulse 87   Temp (!) 97.4 F (36.3 C) (Oral)   Resp 17   Ht 5\' 4"  (1.626 m)   Wt 157 lb (71.2 kg)   LMP 05/04/2018 (Exact  Date)   SpO2 97%   BMI 26.95 kg/m   Physical Exam  Constitutional: She is oriented to person, place, and time. She appears well-developed and well-nourished.  HENT:  Right Ear: There is swelling and tenderness. Tympanic membrane is erythematous.  Left Ear: No tenderness. A middle ear effusion is present.  Nose: Mucosal edema and rhinorrhea present.  Mouth/Throat: Uvula is midline and oropharynx is clear and moist.  Cardiovascular: Normal rate, regular rhythm and normal heart sounds.  Pulmonary/Chest: Effort normal and breath sounds normal.  Musculoskeletal: Normal range of motion.  Neurological: She is alert and oriented to person, place, and time.  Skin: Skin is warm and dry.  Psychiatric: She has a normal mood and affect. Her behavior is normal. Judgment and thought content normal.    Wt Readings from Last 3 Encounters:  05/19/18 157 lb (71.2 kg)  05/06/18 155 lb (70.3 kg)  03/08/18 155 lb 12.8 oz (70.7 kg)    Health Maintenance Due  Topic Date Due  . HIV Screening  06/19/1993  . TETANUS/TDAP  06/19/1997  . PAP SMEAR-Modifier  06/20/1999    There are no preventive care reminders to display for this patient.   Lab Results  Component Value Date   TSH 1.490 03/08/2018   Lab Results  Component Value Date   WBC 6.3 03/08/2018   HGB 13.8 03/08/2018   HCT 40.9 03/08/2018   MCV 79 03/08/2018   PLT 344 03/08/2018   Lab Results  Component Value Date   NA 143 03/08/2018   K 4.5 03/11/2018   CO2 17 (L) 03/08/2018   GLUCOSE 80 03/08/2018   BUN 10 03/08/2018   CREATININE 0.72 03/08/2018   BILITOT 0.5 03/08/2018   ALKPHOS 86 03/08/2018   AST 29 03/08/2018   ALT 11 03/08/2018   PROT 7.1 03/08/2018   ALBUMIN 4.5 03/08/2018   CALCIUM 9.1 03/08/2018   Lab Results  Component Value Date   CHOL 248 (H) 03/08/2018   Lab Results  Component Value Date   HDL 53 03/08/2018   Lab Results  Component Value Date   LDLCALC 169 (H) 03/08/2018   Lab Results  Component  Value Date   TRIG 130 03/08/2018   Lab Results  Component Value Date   CHOLHDL 4.7 (H) 03/08/2018   Lab Results  Component Value Date   HGBA1C 5.5 03/08/2018  Assessment & Plan:  1. Vitamin D deficiency - Vitamin D, 25-hydroxy, repeating vitamin D level today.  If remains the same or still low I will continue vitamin D replacement for a period of an additional 3 to 6 months.  2. Non-recurrent acute suppurative otitis media of right ear without spontaneous rupture of tympanic membrane 3. Other acute sinusitis, recurrence not specified Start azithromycin pack complete as instructed. For nasal congestion I prescribed Atrovent nasal spray 3 times daily as needed For cough take benzonatate 100 to 200 mg up to 3 times daily as needed for cough. Recommended hydration with water and rest until symptoms resolve.   Meds ordered this encounter  Medications  . azithromycin (ZITHROMAX) 250 MG tablet    Sig: Take 2 tabs PO x 1 dose, then 1 tab PO QD x 4 days    Dispense:  6 tablet    Refill:  0  . ipratropium (ATROVENT) 0.03 % nasal spray    Sig: Place 2 sprays into both nostrils 3 (three) times daily.    Dispense:  30 mL    Refill:  0  . benzonatate (TESSALON) 100 MG capsule    Sig: Take 1-2 capsules (100-200 mg total) by mouth 3 (three) times daily as needed for cough.    Dispense:  40 capsule    Refill:  0     Joaquin CourtsKimberly Isobella Ascher, FNP

## 2018-05-20 ENCOUNTER — Encounter: Payer: Self-pay | Admitting: Family Medicine

## 2018-05-20 LAB — VITAMIN D 25 HYDROXY (VIT D DEFICIENCY, FRACTURES): Vit D, 25-Hydroxy: 42.1 ng/mL (ref 30.0–100.0)

## 2018-06-09 ENCOUNTER — Ambulatory Visit: Payer: Medicare Other | Admitting: Family Medicine

## 2018-06-22 ENCOUNTER — Other Ambulatory Visit (HOSPITAL_COMMUNITY)
Admission: RE | Admit: 2018-06-22 | Discharge: 2018-06-22 | Disposition: A | Discharge status: Home / Self Care | Payer: Medicare Other | Source: Ambulatory Visit | Attending: Family Medicine | Admitting: Family Medicine

## 2018-06-22 ENCOUNTER — Ambulatory Visit (INDEPENDENT_AMBULATORY_CARE_PROVIDER_SITE_OTHER): Payer: Medicare Other | Admitting: Family Medicine

## 2018-06-22 ENCOUNTER — Encounter: Payer: Self-pay | Admitting: Family Medicine

## 2018-06-22 VITALS — BP 105/64 | HR 104 | Resp 17 | Ht 64.0 in | Wt 151.6 lb

## 2018-06-22 DIAGNOSIS — R319 Hematuria, unspecified: Secondary | ICD-10-CM

## 2018-06-22 DIAGNOSIS — Z114 Encounter for screening for human immunodeficiency virus [HIV]: Secondary | ICD-10-CM | POA: Diagnosis not present

## 2018-06-22 DIAGNOSIS — Z1231 Encounter for screening mammogram for malignant neoplasm of breast: Secondary | ICD-10-CM | POA: Diagnosis not present

## 2018-06-22 DIAGNOSIS — Z01419 Encounter for gynecological examination (general) (routine) without abnormal findings: Secondary | ICD-10-CM | POA: Diagnosis not present

## 2018-06-22 DIAGNOSIS — Z3202 Encounter for pregnancy test, result negative: Secondary | ICD-10-CM

## 2018-06-22 LAB — POCT URINE PREGNANCY: Preg Test, Ur: NEGATIVE

## 2018-06-22 LAB — POCT URINALYSIS DIP (CLINITEK)
BILIRUBIN UA: NEGATIVE
BILIRUBIN UA: NEGATIVE mg/dL
Glucose, UA: NEGATIVE mg/dL
LEUKOCYTES UA: NEGATIVE
Nitrite, UA: NEGATIVE
POC PROTEIN,UA: NEGATIVE
Spec Grav, UA: 1.025 (ref 1.010–1.025)
Urobilinogen, UA: 0.2 E.U./dL
pH, UA: 7 (ref 5.0–8.0)

## 2018-06-22 MED ORDER — MELOXICAM 15 MG PO TABS: 15.0000 mg | ORAL_TABLET | Freq: Every day | ORAL | 3 refills | Status: DC

## 2018-06-22 NOTE — Patient Instructions (Signed)
Preventive Care 40-64 Years, Female Preventive care refers to lifestyle choices and visits with your health care provider that can promote health and wellness. What does preventive care include?   A yearly physical exam. This is also called an annual well check.  Dental exams once or twice a year.  Routine eye exams. Ask your health care provider how often you should have your eyes checked.  Personal lifestyle choices, including: ? Daily care of your teeth and gums. ? Regular physical activity. ? Eating a healthy diet. ? Avoiding tobacco and drug use. ? Limiting alcohol use. ? Practicing safe sex. ? Taking low-dose aspirin daily starting at age 50. ? Taking vitamin and mineral supplements as recommended by your health care provider. What happens during an annual well check? The services and screenings done by your health care provider during your annual well check will depend on your age, overall health, lifestyle risk factors, and family history of disease. Counseling Your health care provider may ask you questions about your:  Alcohol use.  Tobacco use.  Drug use.  Emotional well-being.  Home and relationship well-being.  Sexual activity.  Eating habits.  Work and work environment.  Method of birth control.  Menstrual cycle.  Pregnancy history. Screening You may have the following tests or measurements:  Height, weight, and BMI.  Blood pressure.  Lipid and cholesterol levels. These may be checked every 5 years, or more frequently if you are over 50 years old.  Skin check.  Lung cancer screening. You may have this screening every year starting at age 55 if you have a 30-pack-year history of smoking and currently smoke or have quit within the past 15 years.  Colorectal cancer screening. All adults should have this screening starting at age 50 and continuing until age 75. Your health care provider may recommend screening at age 45. You will have tests every  1-10 years, depending on your results and the type of screening test. People at increased risk should start screening at an earlier age. Screening tests may include: ? Guaiac-based fecal occult blood testing. ? Fecal immunochemical test (FIT). ? Stool DNA test. ? Virtual colonoscopy. ? Sigmoidoscopy. During this test, a flexible tube with a tiny camera (sigmoidoscope) is used to examine your rectum and lower colon. The sigmoidoscope is inserted through your anus into your rectum and lower colon. ? Colonoscopy. During this test, a long, thin, flexible tube with a tiny camera (colonoscope) is used to examine your entire colon and rectum.  Hepatitis C blood test.  Hepatitis B blood test.  Sexually transmitted disease (STD) testing.  Diabetes screening. This is done by checking your blood sugar (glucose) after you have not eaten for a while (fasting). You may have this done every 1-3 years.  Mammogram. This may be done every 1-2 years. Talk to your health care provider about when you should start having regular mammograms. This may depend on whether you have a family history of breast cancer.  BRCA-related cancer screening. This may be done if you have a family history of breast, ovarian, tubal, or peritoneal cancers.  Pelvic exam and Pap test. This may be done every 3 years starting at age 21. Starting at age 30, this may be done every 5 years if you have a Pap test in combination with an HPV test.  Bone density scan. This is done to screen for osteoporosis. You may have this scan if you are at high risk for osteoporosis. Discuss your test results, treatment options,   and if necessary, the need for more tests with your health care provider. Vaccines Your health care provider may recommend certain vaccines, such as:  Influenza vaccine. This is recommended every year.  Tetanus, diphtheria, and acellular pertussis (Tdap, Td) vaccine. You may need a Td booster every 10 years.  Varicella  vaccine. You may need this if you have not been vaccinated.  Zoster vaccine. You may need this after age 38.  Measles, mumps, and rubella (MMR) vaccine. You may need at least one dose of MMR if you were born in 1957 or later. You may also need a second dose.  Pneumococcal 13-valent conjugate (PCV13) vaccine. You may need this if you have certain conditions and were not previously vaccinated.  Pneumococcal polysaccharide (PPSV23) vaccine. You may need one or two doses if you smoke cigarettes or if you have certain conditions.  Meningococcal vaccine. You may need this if you have certain conditions.  Hepatitis A vaccine. You may need this if you have certain conditions or if you travel or work in places where you may be exposed to hepatitis A.  Hepatitis B vaccine. You may need this if you have certain conditions or if you travel or work in places where you may be exposed to hepatitis B.  Haemophilus influenzae type b (Hib) vaccine. You may need this if you have certain conditions. Talk to your health care provider about which screenings and vaccines you need and how often you need them. This information is not intended to replace advice given to you by your health care provider. Make sure you discuss any questions you have with your health care provider. Document Released: 05/17/2015 Document Revised: 06/10/2017 Document Reviewed: 02/19/2015 Elsevier Interactive Patient Education  2019 [employer].

## 2018-06-22 NOTE — Progress Notes (Signed)
Established Patient Office Visit  Subjective:  Patient ID: Diana Townsend, female    DOB: [birth year]  Age: 38 y.o. MRN: [id number]  CC:  Chief Complaint  Patient presents with  . Gynecologic Exam    HPI Diana Townsend presents for gynecological exam only.  Denies any AUB, cramping, dysuria, or abnormal vaginal discharge. No currently prescribed contraception.  Requests a follow-up of referral placed in November to dermatology and a letter for her emotional support animal. No other complaints addressed today.   Past Medical History:  Diagnosis Date  . Anxiety   . Depression   . Low blood pressure   . PTSD (post-traumatic stress disorder)   . Vertigo      Family History  Problem Relation Age of Onset  . Diabetes Mother   . Heart disease Mother   . COPD Father   . Diabetes Father   . Kidney disease Father   . Hypertension Father   . Heart failure Father   . Seizures Father   . Seizures Sister   . Anemia Sister   . Diabetes Maternal Aunt   . Heart disease Maternal Grandfather   . Heart disease Paternal Grandmother     Social History   Socioeconomic History  . Marital status: Single    Spouse name: Not on file  . Number of children: Not on file  . Years of education: Not on file  . Highest education level: Not on file  Occupational History  . Not on file  Social Needs  . Financial resource strain: Not on file  . Food insecurity:    Worry: Not on file    Inability: Not on file  . Transportation needs:    Medical: Not on file    Non-medical: Not on file  Tobacco Use  . Smoking status: Light Tobacco Smoker    Types: Cigars  . Smokeless tobacco: Never Used  Substance and Sexual Activity  . Alcohol use: Not Currently  . Drug use: Never  . Sexual activity: Not Currently  Lifestyle  . Physical activity:    Days per week: Not on file    Minutes per session: Not on file  . Stress: Not on file  Relationships  . Social connections:    Talks on phone: Not on file     Gets together: Not on file    Attends religious service: Not on file    Active member of club or organization: Not on file    Attends meetings of clubs or organizations: Not on file    Relationship status: Not on file  . Intimate partner violence:    Fear of current or ex partner: Not on file    Emotionally abused: Not on file    Physically abused: Not on file    Forced sexual activity: Not on file  Other Topics Concern  . Not on file  Social History Narrative  . Not on file    Outpatient Medications Prior to Visit  Medication Sig Dispense Refill  . atorvastatin (LIPITOR) 20 MG tablet Take 1 tablet (20 mg total) by mouth daily. 90 tablet 3  . meloxicam (MOBIC) 15 MG tablet Take 1 tablet (15 mg total) by mouth daily. 30 tablet 1  . triamcinolone cream (KENALOG) 0.1 % Apply 1 application topically 2 (two) times daily. 454 g 1  . azithromycin (ZITHROMAX) 250 MG tablet Take 2 tabs PO x 1 dose, then 1 tab PO QD x 4 days 6 tablet 0  . benzonatate (TESSALON)  100 MG capsule Take 1-2 capsules (100-200 mg total) by mouth 3 (three) times daily as needed for cough. 40 capsule 0  . ipratropium (ATROVENT) 0.03 % nasal spray Place 2 sprays into both nostrils 3 (three) times daily. 30 mL 0  . Vitamin D, Ergocalciferol, (DRISDOL) 1.25 MG (50000 UT) CAPS capsule Take 1 capsule (50,000 Units total) by mouth every 7 (seven) days. 12 capsule 1   No facility-administered medications prior to visit.     No Known Allergies  ROS Review of Systems Pertinent negatives listed in HPI   Objective:    Physical Exam BP 105/64   Pulse (!) 104   Resp 17   Ht 5\' 4"  (1.626 m)   Wt 151 lb 9.6 oz (68.8 kg)   LMP 06/09/2018 (Exact Date)   SpO2 98%   BMI 26.02 kg/m   General appearance: alert, well developed, well nourished, cooperative and in no distress Head: Normocephalic, without obvious abnormality, atraumatic Respiratory: Respirations even and unlabored, normal respiratory rate Gynecological  Exam: Breasts are symmetric without cutaneous changes, nipple inversion or discharge. No masses or tenderness, and no axillary lymphadenopathy. Normal female external genitalia without lesion. No inguinal lymphadenopathy. Vaginal mucosa is pink and moist without lesions. Cervix is with thin white discharge, not friable. Pap smear obtained. No cervical motion tenderness, adnexal fullness or tenderness. Extremities: No gross deformities Skin: Skin color, texture, turgor normal. No rashes seen  Psych: Appropriate mood and affect. Neurologic: Mental status: Alert, oriented to person, place, and time, thought content appropriate.    Wt Readings from Last 3 Encounters:  06/22/18 151 lb 9.6 oz (68.8 kg)  05/19/18 157 lb (71.2 kg)  05/06/18 155 lb (70.3 kg)     Health Maintenance Due  Topic Date Due  . HIV Screening  06/19/1993  . TETANUS/TDAP  06/19/1997  . PAP SMEAR-Modifier  06/20/1999    There are no preventive care reminders to display for this patient.  Lab Results  Component Value Date   TSH 1.490 03/08/2018   Lab Results  Component Value Date   WBC 6.3 03/08/2018   HGB 13.8 03/08/2018   HCT 40.9 03/08/2018   MCV 79 03/08/2018   PLT 344 03/08/2018   Lab Results  Component Value Date   NA 143 03/08/2018   K 4.5 03/11/2018   CO2 17 (L) 03/08/2018   GLUCOSE 80 03/08/2018   BUN 10 03/08/2018   CREATININE 0.72 03/08/2018   BILITOT 0.5 03/08/2018   ALKPHOS 86 03/08/2018   AST 29 03/08/2018   ALT 11 03/08/2018   PROT 7.1 03/08/2018   ALBUMIN 4.5 03/08/2018   CALCIUM 9.1 03/08/2018   Lab Results  Component Value Date   CHOL 248 (H) 03/08/2018   Lab Results  Component Value Date   HDL 53 03/08/2018   Lab Results  Component Value Date   LDLCALC 169 (H) 03/08/2018   Lab Results  Component Value Date   TRIG 130 03/08/2018   Lab Results  Component Value Date   CHOLHDL 4.7 (H) 03/08/2018   Lab Results  Component Value Date   HGBA1C 5.5 03/08/2018      Assessment & Plan:  1. Encounter for gynecological examination - POCT URINALYSIS DIP (CLINITEK) - POCT urine pregnancy - Cytology - PAP([place])  2. Screening for HIV (human immunodeficiency virus) - HIV antibody (with reflex)  3. Breast cancer screening by mammogram - MM Digital Screening; Future   Note sent to referral coordinators requesting follow-up of previously placed dermatology referral.  Will complete emotional support animal letter and patient will be able to pick-up no later than 06/24/18.  Meds ordered this encounter  Medications  . meloxicam (MOBIC) 15 MG tablet    Sig: Take 1 tablet (15 mg total) by mouth daily.    Dispense:  30 tablet    Refill:  3    Follow-up: No follow-ups on file.    Joaquin Courts, FNP-C

## 2018-06-23 LAB — HIV ANTIBODY (ROUTINE TESTING W REFLEX): HIV Screen 4th Generation wRfx: NONREACTIVE

## 2018-06-24 LAB — CYTOLOGY - PAP
Adequacy: ABSENT
Bacterial vaginitis: NEGATIVE
Candida vaginitis: NEGATIVE
Chlamydia: NEGATIVE
Diagnosis: NEGATIVE
Neisseria Gonorrhea: NEGATIVE
TRICH (WINDOWPATH): NEGATIVE

## 2018-06-28 ENCOUNTER — Other Ambulatory Visit: Payer: Self-pay

## 2018-06-28 ENCOUNTER — Emergency Department (HOSPITAL_COMMUNITY)
Admission: EM | Admit: 2018-06-28 | Discharge: 2018-06-28 | Discharge status: Against Medical Advice | Payer: Medicare Other | Attending: Emergency Medicine | Admitting: Emergency Medicine

## 2018-06-28 DIAGNOSIS — Z5320 Procedure and treatment not carried out because of patient's decision for unspecified reasons: Secondary | ICD-10-CM | POA: Diagnosis not present

## 2018-06-28 DIAGNOSIS — R1011 Right upper quadrant pain: Secondary | ICD-10-CM | POA: Insufficient documentation

## 2018-06-28 DIAGNOSIS — R1010 Upper abdominal pain, unspecified: Secondary | ICD-10-CM

## 2018-06-28 LAB — COMPREHENSIVE METABOLIC PANEL
ALT: 11 U/L (ref 0–44)
AST: 14 U/L — ABNORMAL LOW (ref 15–41)
Albumin: 3.5 g/dL (ref 3.5–5.0)
Alkaline Phosphatase: 65 U/L (ref 38–126)
Anion gap: 9 (ref 5–15)
BUN: 8 mg/dL (ref 6–20)
CO2: 23 mmol/L (ref 22–32)
Calcium: 9 mg/dL (ref 8.9–10.3)
Chloride: 105 mmol/L (ref 98–111)
Creatinine, Ser: 0.74 mg/dL (ref 0.44–1.00)
GFR calc Af Amer: >60 mL/min (ref >60)
GFR calc non Af Amer: >60 mL/min (ref >60)
Glucose, Bld: 78 mg/dL (ref 70–99)
Potassium: 4 mmol/L (ref 3.5–5.1)
Sodium: 137 mmol/L (ref 135–145)
Total Bilirubin: 0.6 mg/dL (ref 0.3–1.2)
Total Protein: 6.2 g/dL — ABNORMAL LOW (ref 6.5–8.1)

## 2018-06-28 LAB — CBC WITH DIFFERENTIAL/PLATELET
Abs Immature Granulocytes: 0.01 10*3/uL (ref 0.00–0.07)
BASOS ABS: 0.1 10*3/uL (ref 0.0–0.1)
Basophils Relative: 1 %
EOS PCT: 3 %
Eosinophils Absolute: 0.2 10*3/uL (ref 0.0–0.5)
HCT: 39.4 % (ref 36.0–46.0)
Hemoglobin: 12.4 g/dL (ref 12.0–15.0)
Immature Granulocytes: 0 %
Lymphocytes Relative: 33 %
Lymphs Abs: 2.3 10*3/uL (ref 0.7–4.0)
MCH: 26.7 pg (ref 26.0–34.0)
MCHC: 31.5 g/dL (ref 30.0–36.0)
MCV: 84.7 fL (ref 80.0–100.0)
MONO ABS: 0.7 10*3/uL (ref 0.1–1.0)
Monocytes Relative: 10 %
Neutro Abs: 3.7 10*3/uL (ref 1.7–7.7)
Neutrophils Relative %: 53 %
Platelets: 253 10*3/uL (ref 150–400)
RBC: 4.65 MIL/uL (ref 3.87–5.11)
RDW: 14.8 % (ref 11.5–15.5)
WBC: 7 10*3/uL (ref 4.0–10.5)
nRBC: 0 % (ref 0.0–0.2)

## 2018-06-28 LAB — I-STAT BETA HCG BLOOD, ED (MC, WL, AP ONLY): I-stat hCG, quantitative: <5 m[IU]/mL (ref <5)

## 2018-06-28 LAB — LIPASE, BLOOD: LIPASE: 34 U/L (ref 11–51)

## 2018-06-28 MED ORDER — PANTOPRAZOLE SODIUM 40 MG IV SOLR
40.0000 mg | Freq: Once | INTRAVENOUS | Status: DC
  Filled 2018-06-28: qty 40

## 2018-06-28 MED ORDER — FENTANYL CITRATE (PF) 100 MCG/2ML IJ SOLN
25.0000 ug | Freq: Once | INTRAMUSCULAR | Status: DC
  Filled 2018-06-28: qty 2

## 2018-06-28 MED ORDER — SODIUM CHLORIDE 0.9 % IV SOLN: INTRAVENOUS | Status: DC

## 2018-06-28 MED ORDER — ONDANSETRON HCL 4 MG/2ML IJ SOLN
4.0000 mg | Freq: Once | INTRAMUSCULAR | Status: DC
  Filled 2018-06-28: qty 2

## 2018-06-28 NOTE — ED Provider Notes (Signed)
[facility] EMERGENCY DEPARTMENT Provider Note   CSN: [id number] Arrival date & time: 06/28/18  7062    History   Chief Complaint Chief Complaint  Patient presents with  . Abdominal Pain    HPI Cathaleen Fresco is a 38 y.o. female.     Patient with acute onset of upper abdominal pain at 6 this morning.  No nausea no vomiting.  Patient's been experiencing pain like this since her gallbladder was removed and 2014.  Primary care doctor is Joaquin Courts.  Patient states that this pains been occurring about once every 2 to 3 months.  Since her gallbladder was removed she has not had any diagnostic work-up.     Past Medical History:  Diagnosis Date  . Anxiety   . Depression   . Low blood pressure   . PTSD (post-traumatic stress disorder)   . Vertigo     There are no active problems to display for this patient.     OB History   No obstetric history on file.      Home Medications    Prior to Admission medications   Medication Sig Start Date End Date Taking? Authorizing Provider  atorvastatin (LIPITOR) 20 MG tablet Take 1 tablet (20 mg total) by mouth daily. 03/10/18   Bing Neighbors, FNP  meloxicam (MOBIC) 15 MG tablet Take 1 tablet (15 mg total) by mouth daily. 06/22/18   Bing Neighbors, FNP  triamcinolone cream (KENALOG) 0.1 % Apply 1 application topically 2 (two) times daily. 03/08/18   Bing Neighbors, FNP    Family History Family History  Problem Relation Age of Onset  . Diabetes Mother   . Heart disease Mother   . COPD Father   . Diabetes Father   . Kidney disease Father   . Hypertension Father   . Heart failure Father   . Seizures Father   . Seizures Sister   . Anemia Sister   . Diabetes Maternal Aunt   . Heart disease Maternal Grandfather   . Heart disease Paternal Grandmother     Social History Social History   Tobacco Use  . Smoking status: Light Tobacco Smoker    Types: Cigars  . Smokeless tobacco: Never Used    Substance Use Topics  . Alcohol use: Not Currently  . Drug use: Never     Allergies   Patient has no known allergies.   Review of Systems Review of Systems  Constitutional: Negative for chills and fever.  HENT: Negative for congestion, rhinorrhea and sore throat.   Eyes: Negative for visual disturbance.  Respiratory: Negative for cough and shortness of breath.   Cardiovascular: Negative for chest pain and leg swelling.  Gastrointestinal: Positive for abdominal pain. Negative for diarrhea, nausea and vomiting.  Genitourinary: Negative for dysuria.  Musculoskeletal: Negative for back pain and neck pain.  Skin: Negative for rash.  Neurological: Negative for dizziness, light-headedness and headaches.  Hematological: Does not bruise/bleed easily.  Psychiatric/Behavioral: Negative for confusion.     Physical Exam Updated Vital Signs BP 106/66   Pulse 73   Temp 98.1 F (36.7 C) (Oral)   Resp 14   Ht 1.626 m (5\' 4" )   Wt 68.8 kg   LMP 06/09/2018 (Exact Date)   SpO2 100%   BMI 26.02 kg/m   Physical Exam Vitals signs and nursing note reviewed.  Constitutional:      General: She is not in acute distress.    Appearance: She is well-developed.  HENT:  Head: Normocephalic and atraumatic.     Mouth/Throat:     Mouth: Mucous membranes are moist.  Eyes:     Extraocular Movements: Extraocular movements intact.     Conjunctiva/sclera: Conjunctivae normal.     Pupils: Pupils are equal, round, and reactive to light.  Neck:     Musculoskeletal: Normal range of motion and neck supple.  Cardiovascular:     Rate and Rhythm: Normal rate and regular rhythm.     Heart sounds: Normal heart sounds. No murmur.  Pulmonary:     Effort: Pulmonary effort is normal. No respiratory distress.     Breath sounds: Normal breath sounds.  Abdominal:     General: Bowel sounds are normal.     Palpations: Abdomen is soft.     Tenderness: There is abdominal tenderness. There is guarding.      Comments: Patient with tenderness to palpation right upper quadrant with guarding.  Musculoskeletal: Normal range of motion.  Skin:    General: Skin is warm and dry.  Neurological:     General: No focal deficit present.     Mental Status: She is alert and oriented to person, place, and time.      ED Treatments / Results  Labs (all labs ordered are listed, but only abnormal results are displayed) Labs Reviewed  COMPREHENSIVE METABOLIC PANEL - Abnormal; Notable for the following components:      Result Value   Total Protein 6.2 (*)    AST 14 (*)    All other components within normal limits  LIPASE, BLOOD  CBC WITH DIFFERENTIAL/PLATELET  I-STAT BETA HCG BLOOD, ED ([facility], [facility], [facility] ONLY)    EKG None  Radiology No results found.  Procedures Procedures (including critical care time)  Medications Ordered in ED Medications  0.9 %  sodium chloride infusion ( Intravenous Not Given 06/28/18 0827)  ondansetron (ZOFRAN) injection 4 mg (4 mg Intravenous Not Given 06/28/18 0827)  fentaNYL (SUBLIMAZE) injection 25 mcg (25 mcg Intravenous Not Given 06/28/18 0827)  pantoprazole (PROTONIX) injection 40 mg (40 mg Intravenous Not Given 06/28/18 0827)     Initial Impression / Assessment and Plan / ED Course  I have reviewed the triage vital signs and the nursing notes.  Pertinent labs & imaging results that were available during my care of the patient were reviewed by me and considered in my medical decision making (see chart for details).        Patient presented for recurrent upper abdominal pain.  She has been experiencing this on and off since her gallbladder was removed in 2014.  Has not had much of a work-up since that time.  Pain will occur about once every 2 to 3 months.  Started this morning at 6 in the morning.  Was planning on doing CT scan plus labs.  Patient initially refused IV which would have included some pain medication but she did agree to stay for labs.  But then we noticed  that patient just eloped and left.  Only lab we have back is CBC which shows normal white blood cell count normal hemoglobin.  Patient is exam was concerning because she did have marked tenderness in the right upper quadrant area.  Also did have some guarding.  This was the reason for the CT.  Final Clinical Impressions(s) / ED Diagnoses   Final diagnoses:  Pain of upper abdomen    ED Discharge Orders    None       Vanetta Mulders, MD 06/28/18 [phone]

## 2018-06-28 NOTE — ED Triage Notes (Signed)
Pt reports 8/10 sharp abdominal pain that started around 0600 this morning while she was getting ready for work. Pt denies any nausea, vomiting, or diarrhea. Pt reports LMP was 06/09/18. Pt reports the pain is worse when she takes a deep breathe.

## 2018-06-28 NOTE — ED Notes (Signed)
This RN found patient's room empty at this time. Pt and all of her belongings are gone. MD made aware that patient has eloped.

## 2018-06-28 NOTE — ED Notes (Signed)
Pt is refusing an IV and all medications at this time. Pt reports her "stomach feels better."

## 2018-07-01 ENCOUNTER — Telehealth: Payer: Self-pay | Admitting: Family Medicine

## 2018-07-01 NOTE — Telephone Encounter (Signed)
Patient notified of lab results. Expressed understanding. 

## 2018-07-01 NOTE — Progress Notes (Signed)
Patient notified of results & recommendations. Expressed understanding.

## 2018-07-01 NOTE — Telephone Encounter (Signed)
Patient called requesting lab results, please follow up °

## 2018-07-11 ENCOUNTER — Ambulatory Visit (INDEPENDENT_AMBULATORY_CARE_PROVIDER_SITE_OTHER): Payer: Medicare Other | Admitting: Family Medicine

## 2018-07-11 ENCOUNTER — Encounter: Payer: Self-pay | Admitting: Family Medicine

## 2018-07-11 ENCOUNTER — Telehealth: Payer: Self-pay | Admitting: Family Medicine

## 2018-07-11 VITALS — BP 121/79 | HR 91 | Temp 98.1°F | Resp 17 | Ht 64.0 in | Wt 151.4 lb

## 2018-07-11 DIAGNOSIS — R1010 Upper abdominal pain, unspecified: Secondary | ICD-10-CM

## 2018-07-11 DIAGNOSIS — L84 Corns and callosities: Secondary | ICD-10-CM | POA: Diagnosis not present

## 2018-07-11 MED ORDER — MUPIROCIN 2 % EX OINT: 1.0000 "application " | TOPICAL_OINTMENT | Freq: Two times a day (BID) | CUTANEOUS | 2 refills | Status: DC

## 2018-07-11 MED ORDER — OMEPRAZOLE 40 MG PO CPDR: 40.0000 mg | DELAYED_RELEASE_CAPSULE | Freq: Every day | ORAL | 3 refills | Status: DC

## 2018-07-11 NOTE — Patient Instructions (Signed)
We will contact you via phone once ultrasound has been scheduled  Abdominal Pain, Adult Abdominal pain can be caused by many things. Often, abdominal pain is not serious and it gets better with no treatment or by being treated at home. However, sometimes abdominal pain is serious. Your health care provider will do a medical history and a physical exam to try to determine the cause of your abdominal pain. Follow these instructions at home:  Take over-the-counter and prescription medicines only as told by your health care provider. Do not take a laxative unless told by your health care provider.  Drink enough fluid to keep your urine clear or pale yellow.  Watch your condition for any changes.  Keep all follow-up visits as told by your health care provider. This is important. Contact a health care provider if:  Your abdominal pain changes or gets worse.  You are not hungry or you lose weight without trying.  You are constipated or have diarrhea for more than 2-3 days.  You have pain when you urinate or have a bowel movement.  Your abdominal pain wakes you up at night.  Your pain gets worse with meals, after eating, or with certain foods.  You are throwing up and cannot keep anything down.  You have a fever. Get help right away if:  Your pain does not go away as soon as your health care provider told you to expect.  You cannot stop throwing up.  Your pain is only in areas of the abdomen, such as the right side or the left lower portion of the abdomen.  You have bloody or black stools, or stools that look like tar.  You have severe pain, cramping, or bloating in your abdomen.  You have signs of dehydration, such as: ? Dark urine, very little urine, or no urine. ? Cracked lips. ? Dry mouth. ? Sunken eyes. ? Sleepiness. ? Weakness. This information is not intended to replace advice given to you by your health care provider. Make sure you discuss any questions you have with  your health care provider. Document Released: 01/28/2005 Document Revised: 11/08/2015 Document Reviewed: 10/02/2015 Elsevier Interactive Patient Education  2019 Elsevier Inc.    Abdominal or Pelvic Ultrasound An ultrasound is a test that uses sound waves to take pictures of the inside of the body. An abdominal ultrasound takes pictures of the inside of your belly (abdomen). A pelvic ultrasound takes pictures of the inside of the area between your hip bones (pelvis). An ultrasound may be done to check an organ or look for problems. This is a safe test that does not hurt. It is done by placing a handheld device called a transducer on the outside of your belly or pelvis and moving it around. Tell your doctor about:  Any allergies you have.  All medicines you are taking.  Any surgeries you have had.  Any medical conditions you have.  Whether you are pregnant or may be pregnant. What are the risks? There are no known risks from having this test. What happens before the procedure?  Follow instructions from your doctor about eating or drinking before the test.  Wear clothing that is easy to wash. Gel from the test might get on your clothes. What happens during the procedure?   You will lie on an exam table.  Your clothes will be moved so your belly and pelvis are showing.  A gel will be put on your skin. It may feel cool.  The transducer  device will be put on your skin. It will be moved back and forth over the area being looked at.  The device will take pictures. They will show on small TV screens.  You may be asked to change your position.  After the exam, the gel will be cleaned off. What happens after the procedure?  It is up to you to get the results of your test. Ask your doctor, or the department that is doing the test, when your results will be ready.  Keep all follow-up visits as told by your doctor. This is important. Summary  An ultrasound is a test that uses  sound waves to take pictures of the inside of the body.  An ultrasound may be done to check an organ or look for problems.  The test is done by moving a handheld device around on the outside of your belly or pelvis.  It is up to you to get the results of your test. Be sure to ask when your results will be ready. This information is not intended to replace advice given to you by your health care provider. Make sure you discuss any questions you have with your health care provider. Document Released: 05/23/2010 Document Revised: 11/15/2017 Document Reviewed: 11/15/2017 Elsevier Interactive Patient Education  [employer].

## 2018-07-11 NOTE — Telephone Encounter (Signed)
Please schedule abdominal ultrasound for patient.

## 2018-07-11 NOTE — Progress Notes (Signed)
Patient ID: Diana Townsend, female    DOB: 1981/03/13, 38 y.o.   MRN: [id number]  PCP: Bing Neighbors, FNP  Chief Complaint  Patient presents with  . Follow-up    ED 2/25: upper abdominal pain.    Subjective:  HPI Diana Townsend is a 38 y.o. female presents for evaluation of chronic RUQ, LUQ, and mid epigastric abdominal pain. History of cholecystomy. Reports painful episodes occur 2-3 times per month.  She characterizes painful episodes as sharp and there is no association with food.  She reports that food often relieves symptoms of sharp pain.  She reports in the past she has been tested for H. pylori which has been negative.  Has had previous EGD and a colonoscopy performed with another health system.  She reports both were benign.  She has no history of peptic ulcer disease.  She reports acid reflux symptoms at times but is currently not taking any medication for acid reflux.  She was recently seen at the ER and they had recommended a CT of the abdomen however patient left before treatment was complete.  Ports that the visit was taking too long and she had to leave.  She has had at least 2 episodes of this pain since her ER visit. She is currently not follow-up by a Gastroenterology specialist. Social History   Socioeconomic History  . Marital status: Single    Spouse name: Not on file  . Number of children: Not on file  . Years of education: Not on file  . Highest education level: Not on file  Occupational History  . Not on file  Social Needs  . Financial resource strain: Not on file  . Food insecurity:    Worry: Not on file    Inability: Not on file  . Transportation needs:    Medical: Not on file    Non-medical: Not on file  Tobacco Use  . Smoking status: Light Tobacco Smoker    Types: Cigars  . Smokeless tobacco: Never Used  Substance and Sexual Activity  . Alcohol use: Not Currently  . Drug use: Never  . Sexual activity: Not Currently  Lifestyle  . Physical  activity:    Days per week: Not on file    Minutes per session: Not on file  . Stress: Not on file  Relationships  . Social connections:    Talks on phone: Not on file    Gets together: Not on file    Attends religious service: Not on file    Active member of club or organization: Not on file    Attends meetings of clubs or organizations: Not on file    Relationship status: Not on file  . Intimate partner violence:    Fear of current or ex partner: Not on file    Emotionally abused: Not on file    Physically abused: Not on file    Forced sexual activity: Not on file  Other Topics Concern  . Not on file  Social History Narrative  . Not on file    Family History  Problem Relation Age of Onset  . Diabetes Mother   . Heart disease Mother   . COPD Father   . Diabetes Father   . Kidney disease Father   . Hypertension Father   . Heart failure Father   . Seizures Father   . Seizures Sister   . Anemia Sister   . Diabetes Maternal Aunt   . Heart disease Maternal Grandfather   .  Heart disease Paternal Grandmother     Review of Systems Pertinent negatives listed in HPI There are no active problems to display for this patient.  No Known Allergies  Prior to Admission medications   Medication Sig Start Date End Date Taking? Authorizing Provider  atorvastatin (LIPITOR) 20 MG tablet Take 1 tablet (20 mg total) by mouth daily. 03/10/18  Yes Bing Neighbors, FNP  meloxicam (MOBIC) 15 MG tablet Take 1 tablet (15 mg total) by mouth daily. 06/22/18  Yes Bing Neighbors, FNP  mupirocin ointment (BACTROBAN) 2 % Place 1 application into the nose 2 (two) times daily.   Yes [provider]  triamcinolone cream (KENALOG) 0.1 % Apply 1 application topically 2 (two) times daily. 03/08/18  Yes Bing Neighbors, FNP    Past Medical, Surgical Family and Social History reviewed and updated.    Objective:   Today's Vitals   07/11/18 1554  BP: 121/79  Pulse: 91  Resp: 17   Temp: 98.1 F (36.7 C)  SpO2: 98%  Weight: 151 lb 6.4 oz (68.7 kg)  Height: 5\' 4"  (1.626 m)    Wt Readings from Last 3 Encounters:  07/11/18 151 lb 6.4 oz (68.7 kg)  06/28/18 151 lb 9.6 oz (68.8 kg)  06/22/18 151 lb 9.6 oz (68.8 kg)     Physical Exam General appearance: alert, well developed, well nourished, cooperative and in no distress Head: Normocephalic, without obvious abnormality, atraumatic Respiratory: Respirations even and unlabored, normal respiratory rate Heart: rate and rhythm normal. No gallop or murmurs noted on exam  Abdomen: BS diminished, no distention, no rebound tenderness, or no mass Extremities: Left Foot thicken calcified lesion on great toe and mid posterior foot Skin: Skin color, texture, turgor normal. No rashes seen  Psych: Appropriate mood and affect. Neurologic: Mental status: Alert, oriented to person, place, and time, thought content appropriate. No results found for: POCGLU  Lab Results  Component Value Date   HGBA1C 5.5 03/08/2018      Assessment & Plan:  1. Pain of upper abdomen Will defer from ordering a CT of the abdomen as patient is not presently in severe pain. Will obtain a US abdomen in efforts to identify cause of symptoms and refer patient to gastroenterology Patient is also willing to trial omeprazole 40 mg daily to evaluate whether or not PPI therapy may resolve symptoms.  2. Callus of foot - Ambulatory referral to Podiatry    -The patient was given clear instructions to go to ER or return to medical center if symptoms do not improve, worsen or new problems develop. The patient verbalized understanding.    Joaquin Courts, FNP Primary Care at [facility] [address] [place] [address] [phone]: [phone]

## 2018-07-12 NOTE — Telephone Encounter (Signed)
Patient's ultrasound is scheduled at [facility] on 07/18/2018 @ 9:45 AM.  Can you let patient know?

## 2018-07-13 ENCOUNTER — Encounter: Payer: Self-pay | Admitting: Podiatry

## 2018-07-13 ENCOUNTER — Other Ambulatory Visit: Payer: Self-pay

## 2018-07-13 ENCOUNTER — Ambulatory Visit (INDEPENDENT_AMBULATORY_CARE_PROVIDER_SITE_OTHER): Payer: Medicare Other | Admitting: Podiatry

## 2018-07-13 VITALS — BP 102/57 | HR 90

## 2018-07-13 DIAGNOSIS — M216X2 Other acquired deformities of left foot: Secondary | ICD-10-CM

## 2018-07-13 DIAGNOSIS — M205X2 Other deformities of toe(s) (acquired), left foot: Secondary | ICD-10-CM | POA: Diagnosis not present

## 2018-07-13 DIAGNOSIS — Q828 Other specified congenital malformations of skin: Secondary | ICD-10-CM | POA: Diagnosis not present

## 2018-07-13 NOTE — Progress Notes (Signed)
This patient presents the office for an evaluation and treatment of painful callus on her left foot.  She says she was treated in [place] by a podiatrist every week who trimmed her callus.  She now presents the office today stating that she develops callus under her left foot and she has tried to self treat but the callus  continues to return.  She says the callus is painful walking and wearing her shoes.  She presents the office today for an evaluation of her left foot.  Vascular  Dorsalis pedis and posterior tibial pulses are palpable  B/L.  Capillary return  WNL.  Temperature gradient is  WNL.  Skin turgor  WNL  Sensorium  Senn Weinstein monofilament wire  WNL. Normal tactile sensation.  Nail Exam  Patient has normal nails with no evidence of bacterial or fungal infection.  Orthopedic  Exam  Muscle tone and muscle strength  WNL.  No limitations of motion feet  B/L.  No crepitus or joint effusion noted.  Foot type is unremarkable and digits show no abnormalities.  Hallux limitus 1st MPJ  B/L.  Porokeratosis sub2 left foot.  Plantar flexed fifth metatarsal left foot.  Skin  No open lesions.  Normal skin texture and turgor.  Porokeratosis sub 2 left foot.  IE.  Discussed this condition with this patient.  Discussed conservative treatment would require her to return to the office every few months for debridement.  She would benefit from a surgical consultation by 1 of the surgeons in the office to determine if surgery would be beneficial.   Was scheduled with Dr. Marylene Land for surgical consultation.  RTC 1 week.   Helane Gunther DPM

## 2018-07-14 NOTE — Telephone Encounter (Signed)
Notified patient.

## 2018-07-18 ENCOUNTER — Other Ambulatory Visit: Payer: Self-pay

## 2018-07-18 ENCOUNTER — Ambulatory Visit (HOSPITAL_COMMUNITY)
Admission: RE | Admit: 2018-07-18 | Discharge: 2018-07-18 | Disposition: A | Discharge status: Home / Self Care | Payer: Medicare Other | Source: Ambulatory Visit | Attending: Family Medicine | Admitting: Family Medicine

## 2018-07-18 DIAGNOSIS — K7689 Other specified diseases of liver: Secondary | ICD-10-CM | POA: Diagnosis not present

## 2018-07-18 DIAGNOSIS — R1010 Upper abdominal pain, unspecified: Secondary | ICD-10-CM | POA: Insufficient documentation

## 2018-07-19 ENCOUNTER — Telehealth: Payer: Self-pay | Admitting: Family Medicine

## 2018-07-19 ENCOUNTER — Ambulatory Visit: Payer: Medicare Other | Admitting: Sports Medicine

## 2018-07-19 DIAGNOSIS — K769 Liver disease, unspecified: Secondary | ICD-10-CM

## 2018-07-19 NOTE — Telephone Encounter (Signed)
Please contact patient to advise recent US abdomen did not completely explain her symptoms. US revealed a lesion on the right lobe of liver which [place] unable to be determined by [place]. MRI is required to identify lesion of liver. Order in the system. After patient is notifed, please schedule according to patient's availability.

## 2018-07-20 NOTE — Telephone Encounter (Signed)
Patient notified of ultrasound results & recommendations. Expressed understanding.  Notified patient of MRI that is scheduled on 08/23/2018 @ 12 pm. Aware that she can't have anything to eat or drink after 8 AM that morning. Since appointment is a month out I also mailed an appointment reminder with instructions.

## 2018-07-26 ENCOUNTER — Ambulatory Visit: Payer: Medicare Other | Admitting: Sports Medicine

## 2018-07-27 ENCOUNTER — Ambulatory Visit: Payer: Medicare Other

## 2018-07-27 ENCOUNTER — Ambulatory Visit (HOSPITAL_COMMUNITY): Payer: Medicare Other

## 2018-08-23 ENCOUNTER — Ambulatory Visit (HOSPITAL_COMMUNITY): Admission: RE | Admit: 2018-08-23 | Payer: Medicare Other | Source: Ambulatory Visit

## 2018-08-26 ENCOUNTER — Ambulatory Visit: Payer: Medicare Other

## 2018-09-06 ENCOUNTER — Encounter: Payer: Self-pay | Admitting: Sports Medicine

## 2018-09-06 ENCOUNTER — Ambulatory Visit (INDEPENDENT_AMBULATORY_CARE_PROVIDER_SITE_OTHER): Payer: Medicare Other

## 2018-09-06 ENCOUNTER — Other Ambulatory Visit: Payer: Self-pay | Admitting: Sports Medicine

## 2018-09-06 ENCOUNTER — Ambulatory Visit (INDEPENDENT_AMBULATORY_CARE_PROVIDER_SITE_OTHER): Payer: Medicare Other | Admitting: Sports Medicine

## 2018-09-06 ENCOUNTER — Other Ambulatory Visit: Payer: Self-pay

## 2018-09-06 DIAGNOSIS — Q828 Other specified congenital malformations of skin: Secondary | ICD-10-CM

## 2018-09-06 DIAGNOSIS — M216X2 Other acquired deformities of left foot: Secondary | ICD-10-CM

## 2018-09-06 DIAGNOSIS — M79672 Pain in left foot: Secondary | ICD-10-CM

## 2018-09-06 DIAGNOSIS — M205X2 Other deformities of toe(s) (acquired), left foot: Secondary | ICD-10-CM

## 2018-09-06 NOTE — Progress Notes (Addendum)
Subjective: Diana Townsend is a 38 y.o. female patient who presents to office for evaluation of Left big toe joint pain and pain at callus areas on the bottom of left foot. Patient complains of progressive pain especially over the last 22 years at the callus areas on the bottom of the left foot. Patient reports that her old podiatrist used to trim the lesions every 2 weeks which helped. Reports that she is here to discuss possible surgery. Patient ranks pain at callus worse than big toe joint. When it build up 7-8/10. Patient denies any other pedal complaints.   There are no active problems to display for this patient.   Current Outpatient Medications on File Prior to Visit  Medication Sig Dispense Refill  . atorvastatin (LIPITOR) 20 MG tablet Take 1 tablet (20 mg total) by mouth daily. 90 tablet 3  . meloxicam (MOBIC) 15 MG tablet Take 1 tablet (15 mg total) by mouth daily. 30 tablet 3  . mupirocin ointment (BACTROBAN) 2 % Place 1 application into the nose 2 (two) times daily. 22 g 2  . omeprazole (PRILOSEC) 40 MG capsule Take 1 capsule (40 mg total) by mouth daily. 30 capsule 3  . triamcinolone cream (KENALOG) 0.1 % Apply 1 application topically 2 (two) times daily. 454 g 1   No current facility-administered medications on file prior to visit.     No Known Allergies   Social History   Socioeconomic History  . Marital status: Single    Spouse name: Not on file  . Number of children: Not on file  . Years of education: Not on file  . Highest education level: Not on file  Occupational History  . Not on file  Social Needs  . Financial resource strain: Not on file  . Food insecurity:    Worry: Not on file    Inability: Not on file  . Transportation needs:    Medical: Not on file    Non-medical: Not on file  Tobacco Use  . Smoking status: Light Tobacco Smoker    Types: Cigars  . Smokeless tobacco: Never Used  Substance and Sexual Activity  . Alcohol use: Not Currently  . Drug  use: Never  . Sexual activity: Not Currently  Lifestyle  . Physical activity:    Days per week: Not on file    Minutes per session: Not on file  . Stress: Not on file  Relationships  . Social connections:    Talks on phone: Not on file    Gets together: Not on file    Attends religious service: Not on file    Active member of club or organization: Not on file    Attends meetings of clubs or organizations: Not on file    Relationship status: Not on file  Other Topics Concern  . Not on file  Social History Narrative  . Not on file    Family History  Problem Relation Age of Onset  . Diabetes Mother   . Heart disease Mother   . COPD Father   . Diabetes Father   . Kidney disease Father   . Hypertension Father   . Heart failure Father   . Seizures Father   . Seizures Sister   . Anemia Sister   . Diabetes Maternal Aunt   . Heart disease Maternal Grandfather   . Heart disease Paternal Grandmother    Past Surgical History:  Procedure Laterality Date  . CARPAL TUNNEL RELEASE Bilateral   . CHOLECYSTECTOMY  Objective:  General: Alert and oriented x3 in no acute distress  Dermatology:+ callus medial hallux, sub met 2 and 5 on left. No open lesions bilateral lower extremities, no webspace macerations, no ecchymosis bilateral, all nails x 10 are well manicured.  Vascular: Dorsalis Pedis and Posterior Tibial pedal pulses 2/4, Capillary Fill Time 3 seconds, (+) pedal hair growth bilateral, no edema bilateral lower extremities, Temperature gradient within normal limits.  Neurology: Gross sensation intact via light touch bilateral.  Musculoskeletal:Moderate tenderness to callus sub met 2>hallux>sub met 5 on left. Mild tenderness with palpation left 1st MTPJ, + limitation without crepitus with range of motion, deformity reducible, tracking not trackbound, there is no 1st ray hypermobility noted bilateral, plantar flex position left 2nd met with long toe. +pes planus.    Xrays   Left Foot    Impression: Intermetatarsal angle within normal limits, narrowing 1st MTPJ, long 2nd metatarsal with prominent metatarsal heads 2 and 5. Mid tarsal breach supportive of pes planus.       Assessment and Plan: Problem List Items Addressed This Visit    None    Visit Diagnoses    Porokeratosis    -  Primary   Pain in left foot       Hallux limitus of left foot       Plantar flexed metatarsal bone of left foot         -Complete examination performed -Xrays reviewed -Discussed treatement options; discussed callus, limitus, & metarasal deformity;conservative and  Surgical management; risks, benefits, alternatives discussed. All patient's questions answered. -Patient wound like to hold off on any surgery at this time until after July due to upcoming move  -At no charge debrided callus with sterile chisel blade on left foot x 3 without incident and applied salinocaine sub met 2 on left covered with bandaid -Recommend continue with good supportive shoes and inserts.  -Patient to return to office as needed for callus trim with Dr. Prudence Davidson and to see me after July when she is ready to schedule surgery or sooner if condition worsens.  Landis Martins, DPM

## 2018-09-14 ENCOUNTER — Encounter: Payer: Self-pay | Admitting: Family Medicine

## 2018-09-14 ENCOUNTER — Other Ambulatory Visit: Payer: Self-pay

## 2018-09-14 ENCOUNTER — Telehealth (INDEPENDENT_AMBULATORY_CARE_PROVIDER_SITE_OTHER): Payer: Medicare Other | Admitting: Family Medicine

## 2018-09-14 ENCOUNTER — Telehealth: Payer: Medicare Other | Admitting: Family Medicine

## 2018-09-14 DIAGNOSIS — K143 Hypertrophy of tongue papillae: Secondary | ICD-10-CM | POA: Diagnosis not present

## 2018-09-14 NOTE — Progress Notes (Deleted)
Called patient to initiate their telephone visit with provider Joaquin Courts, FNP-C. Verified date of birth. Patient states that she noticed bumps on the back of her tongue about 4 weeks ago. States that they are flesh colored & nonpainful. KWalker, CMA.

## 2018-09-14 NOTE — Progress Notes (Signed)
Virtual Visit via Telephone Note  I connected with Hadasah Klumb on 09/14/18 at 10:30 AM EDT by telephone and verified that I am speaking with the correct person using two identifiers.  Location: Patient: Located at home during today's encounter  Provider: Located at primary care office     I discussed the limitations, risks, security and privacy concerns of performing an evaluation and management service by telephone and the availability of in person appointments. I also discussed with the patient that there may be a patient responsible charge related to this service. The patient expressed understanding and agreed to proceed.   History of Present Illness: Binta complains of enlarged "bumps" on the back of tongue. This problem has been present for at least 2-3 weeks. She has not made any recent dietary changes, however, has began to use a herbal toothpaste which she doesn't use everyday. Her tongue is non-painful. No other lesion note within the mouth.no fever, chills, nausea, or vomiting.   Observation Viewed photos uploaded via My chart. Tongue papillae enlarge at the base of the tongue. Non-blistering. No other abnormalities noted of the tongue.  Assessment and Plan: 1. Tongue papillae hypertrophy Recommend warm salt water gargles and trial benadryl 25 mg PRN. Cautioned provided as benadryl can cause drowsiness.  Follow Up Instructions: 2 weeks face to face to evaluate tongue if no improvement    I discussed the assessment and treatment plan with the patient. The patient was provided an opportunity to ask questions and all were answered. The patient agreed with the plan and demonstrated an understanding of the instructions.   The patient was advised to call back or seek an in-person evaluation if the symptoms worsen or if the condition fails to improve as anticipated.  I provided 20 minutes of non-face-to-face time during this encounter.   Joaquin Courts, FNP-C

## 2018-09-16 ENCOUNTER — Encounter (HOSPITAL_COMMUNITY): Payer: Self-pay

## 2018-09-16 ENCOUNTER — Encounter: Payer: Self-pay | Admitting: Family Medicine

## 2018-09-16 ENCOUNTER — Emergency Department (HOSPITAL_COMMUNITY)
Admission: EM | Admit: 2018-09-16 | Discharge: 2018-09-16 | Disposition: A | Discharge status: Home / Self Care | Payer: Medicare Other | Attending: Emergency Medicine | Admitting: Emergency Medicine

## 2018-09-16 ENCOUNTER — Other Ambulatory Visit: Payer: Self-pay

## 2018-09-16 DIAGNOSIS — B3731 Acute candidiasis of vulva and vagina: Secondary | ICD-10-CM

## 2018-09-16 DIAGNOSIS — N3000 Acute cystitis without hematuria: Secondary | ICD-10-CM

## 2018-09-16 DIAGNOSIS — N898 Other specified noninflammatory disorders of vagina: Secondary | ICD-10-CM | POA: Diagnosis present

## 2018-09-16 DIAGNOSIS — B373 Candidiasis of vulva and vagina: Secondary | ICD-10-CM | POA: Diagnosis not present

## 2018-09-16 DIAGNOSIS — Z91040 Latex allergy status: Secondary | ICD-10-CM | POA: Insufficient documentation

## 2018-09-16 DIAGNOSIS — F17290 Nicotine dependence, other tobacco product, uncomplicated: Secondary | ICD-10-CM | POA: Insufficient documentation

## 2018-09-16 LAB — URINALYSIS, ROUTINE W REFLEX MICROSCOPIC
Bilirubin Urine: NEGATIVE
Glucose, UA: NEGATIVE mg/dL
Ketones, ur: NEGATIVE mg/dL
Nitrite: NEGATIVE
Protein, ur: 30 mg/dL — AB
Specific Gravity, Urine: 1.029 (ref 1.005–1.030)
pH: 5 (ref 5.0–8.0)

## 2018-09-16 LAB — WET PREP, GENITAL
Clue Cells Wet Prep HPF POC: NONE SEEN
Sperm: NONE SEEN
Trich, Wet Prep: NONE SEEN
Yeast Wet Prep HPF POC: NONE SEEN

## 2018-09-16 LAB — GC/CHLAMYDIA PROBE AMP (~~LOC~~) NOT AT ARMC
Chlamydia: NEGATIVE
Neisseria Gonorrhea: NEGATIVE

## 2018-09-16 LAB — POC URINE PREG, ED: Preg Test, Ur: NEGATIVE

## 2018-09-16 MED ORDER — CEPHALEXIN 500 MG PO CAPS: 500.0000 mg | ORAL_CAPSULE | Freq: Four times a day (QID) | ORAL | 0 refills | Status: DC

## 2018-09-16 MED ORDER — FLUCONAZOLE 150 MG PO TABS
150.0000 mg | ORAL_TABLET | Freq: Once | ORAL | Status: AC
  Administered 2018-09-16: 150 mg via ORAL
  Filled 2018-09-16: qty 1

## 2018-09-16 NOTE — Discharge Instructions (Signed)
1. Medications: keflex, usual home medications 2. Treatment: rest, drink plenty of fluids, advance diet slowly 3. Follow Up: Please followup with your primary doctor in 2 days for discussion of your diagnoses and further evaluation after today's visit; if you do not have a primary care doctor use the resource guide provided to find one; Please return to the ER for persistent vomiting, high fevers, abdominal pain or worsening symptoms  You have been tested for chlamydia and gonorrhea.  These results will be available in approximately 3 days.  Please inform all sexual partners if you test positive for any of these diseases.

## 2018-09-16 NOTE — ED Triage Notes (Signed)
Pt states that since yesterday she has been having hesitation with urination, discharge today, reports sexually active 3 days ago with protection

## 2018-09-16 NOTE — ED Provider Notes (Signed)
[facility] EMERGENCY DEPARTMENT Provider Note   CSN: [id number] Arrival date & time: 09/16/18  0432    History   Chief Complaint Chief Complaint  Patient presents with  . Dysuria    HPI Diana Townsend is a 38 y.o. female with a hx of anxiety, PTSD, depression presents to the Emergency Department complaining of gradual, persistent, progressively worsening urinary urgency onset today. Associated symptoms include vaginal discharge.  Pt reports she is sexually active with 1 female partner with consistent condom usage.  She reports last intercourse was 3 days ago.  Pt denies abd pain, N/V/D, weakness, dizziness, syncope, dysuria, vaginal itching, fever, chills, chest pain, SOB.  She denies known sick contacts.  No treatment PTA.  No aggravating or alleviating factors.         The history is provided by the patient and medical records. No language interpreter was used.    Past Medical History:  Diagnosis Date  . Anxiety   . Depression   . Low blood pressure   . PTSD (post-traumatic stress disorder)   . Vertigo     There are no active problems to display for this patient.   Past Surgical History:  Procedure Laterality Date  . CARPAL TUNNEL RELEASE Bilateral   . CHOLECYSTECTOMY       OB History   No obstetric history on file.      Home Medications    Prior to Admission medications   Medication Sig Start Date End Date Taking? Authorizing Provider  atorvastatin (LIPITOR) 20 MG tablet Take 1 tablet (20 mg total) by mouth daily. Patient not taking: Reported on 09/16/2018 03/10/18   Bing Neighbors, FNP  cephALEXin (KEFLEX) 500 MG capsule Take 1 capsule (500 mg total) by mouth 4 (four) times daily. 09/16/18   Shere Eisenhart, Dahlia Client, PA-C  meloxicam (MOBIC) 15 MG tablet Take 1 tablet (15 mg total) by mouth daily. Patient not taking: Reported on 09/16/2018 06/22/18   Bing Neighbors, FNP  mupirocin ointment (BACTROBAN) 2 % Place 1 application into the nose 2  (two) times daily. Patient not taking: Reported on 09/16/2018 07/11/18   Bing Neighbors, FNP  triamcinolone cream (KENALOG) 0.1 % Apply 1 application topically 2 (two) times daily. Patient not taking: Reported on 09/16/2018 03/08/18   Bing Neighbors, FNP    Family History Family History  Problem Relation Age of Onset  . Diabetes Mother   . Heart disease Mother   . COPD Father   . Diabetes Father   . Kidney disease Father   . Hypertension Father   . Heart failure Father   . Seizures Father   . Seizures Sister   . Anemia Sister   . Diabetes Maternal Aunt   . Heart disease Maternal Grandfather   . Heart disease Paternal Grandmother     Social History Social History   Tobacco Use  . Smoking status: Light Tobacco Smoker    Types: Cigars  . Smokeless tobacco: Never Used  Substance Use Topics  . Alcohol use: Not Currently  . Drug use: Never     Allergies   Latex   Review of Systems Review of Systems  Constitutional: Negative for appetite change, diaphoresis, fatigue, fever and unexpected weight change.  HENT: Negative for mouth sores.   Eyes: Negative for visual disturbance.  Respiratory: Negative for cough, chest tightness, shortness of breath and wheezing.   Cardiovascular: Negative for chest pain.  Gastrointestinal: Negative for abdominal pain, constipation, diarrhea, nausea and vomiting.  Endocrine: Negative for polydipsia, polyphagia and polyuria.  Genitourinary: Positive for urgency and vaginal discharge. Negative for dysuria, frequency and hematuria.  Musculoskeletal: Negative for back pain and neck stiffness.  Skin: Negative for rash.  Allergic/Immunologic: Negative for immunocompromised state.  Neurological: Negative for syncope, light-headedness and headaches.  Hematological: Does not bruise/bleed easily.  Psychiatric/Behavioral: Negative for sleep disturbance. The patient is not nervous/anxious.      Physical Exam Updated Vital Signs BP 110/63 (BP  Location: Right Arm)   Pulse 76   Temp 98.3 F (36.8 C) (Oral)   Resp 16   Ht 5\' 4"  (1.626 m)   LMP 08/25/2018 (Exact Date)   SpO2 98%   BMI 25.99 kg/m   Physical Exam Vitals signs and nursing note reviewed.  Constitutional:      General: She is not in acute distress.    Appearance: She is well-developed. She is not diaphoretic.  HENT:     Head: Normocephalic and atraumatic.  Eyes:     Conjunctiva/sclera: Conjunctivae normal.  Neck:     Musculoskeletal: Normal range of motion.  Cardiovascular:     Rate and Rhythm: Normal rate and regular rhythm.     Heart sounds: Normal heart sounds. No murmur.  Pulmonary:     Effort: Pulmonary effort is normal. No respiratory distress.     Breath sounds: Normal breath sounds. No wheezing.  Abdominal:     General: Bowel sounds are normal.     Palpations: Abdomen is soft.     Tenderness: There is no abdominal tenderness. There is no guarding or rebound.     Hernia: There is no hernia in the right inguinal area or left inguinal area.  Genitourinary:    Labia:        Right: No rash, tenderness or lesion.        Left: No rash, tenderness or lesion.      Vagina: No signs of injury and foreign body. Vaginal discharge (thick, white, nonodorous, clinging to the vaginal walls) present. No erythema, tenderness or bleeding.     Cervix: No cervical motion tenderness, discharge or friability.     Uterus: Not deviated, not enlarged, not fixed and not tender.      Adnexa:        Right: No mass, tenderness or fullness.         Left: No mass, tenderness or fullness.    Musculoskeletal: Normal range of motion.  Skin:    General: Skin is warm and dry.     Findings: No erythema.  Neurological:     Mental Status: She is alert.      ED Treatments / Results  Labs (all labs ordered are listed, but only abnormal results are displayed) Labs Reviewed  WET PREP, GENITAL - Abnormal; Notable for the following components:      Result Value   WBC, Wet Prep  HPF POC FEW (*)    All other components within normal limits  URINALYSIS, ROUTINE W REFLEX MICROSCOPIC - Abnormal; Notable for the following components:   Color, Urine AMBER (*)    APPearance CLOUDY (*)    Hgb urine dipstick MODERATE (*)    Protein, ur 30 (*)    Leukocytes,Ua TRACE (*)    Bacteria, UA RARE (*)    All other components within normal limits  URINE CULTURE  POC URINE PREG, ED  GC/CHLAMYDIA PROBE AMP ([place]) NOT AT [facility]     Procedures Procedures (including critical care time)  Medications Ordered in  ED Medications  fluconazole (DIFLUCAN) tablet 150 mg (has no administration in time range)     Initial Impression / Assessment and Plan / ED Course  I have reviewed the triage vital signs and the nursing notes.  Pertinent labs & imaging results that were available during my care of the patient were reviewed by me and considered in my medical decision making (see chart for details).  Clinical Course as of Sep 16 619  Fri Sep 16, 2018  [id number] Pt initially tachycardic, but not tachycardic on my exam. Suspect initial was anxiety related.  Pulse Rate: 76 [HM]    Clinical Course User Index [HM] Psalms Olarte, Dahlia ClientHannah, PA-C       Pt with urinary urgency and vaginal d/c.  Discharge clinically consistent with yeast. Pt reports no concern for STD. UA with evidence of UTI.  Pt is afebrile, no CVA tenderness, normotensive, and denies N/V. Pt to be dc home with antibiotics. Treated for vaginal yeast in the ED.  Pt understands that they have GC/Chlamydia cultures pending and that they will need to inform all sexual partners if results return positive.  Pt not concerning for PID because hemodynamically stable and no cervical motion tenderness on pelvic exam. Patient to be discharged with instructions to follow up with OBGYN/PCP. Discussed importance of using protection when sexually active.    Final Clinical Impressions(s) / ED Diagnoses   Final diagnoses:  Acute cystitis  without hematuria  Vaginal yeast infection  Vaginal discharge    ED Discharge Orders         Ordered    cephALEXin (KEFLEX) 500 MG capsule  4 times daily     09/16/18 0615           Jalani Cullifer, Dahlia ClientHannah, PA-C 09/16/18 [id number]    Gilda CreasePollina, Christopher J, MD 09/16/18 [id number]

## 2018-09-17 LAB — URINE CULTURE: Culture: <10000 — AB

## 2018-09-18 ENCOUNTER — Other Ambulatory Visit: Payer: Self-pay | Admitting: Family Medicine

## 2018-09-18 ENCOUNTER — Encounter: Payer: Self-pay | Admitting: Family Medicine

## 2018-09-18 MED ORDER — NITROFURANTOIN MONOHYD MACRO 100 MG PO CAPS: 100.0000 mg | ORAL_CAPSULE | Freq: Two times a day (BID) | ORAL | 0 refills | Status: AC

## 2018-09-18 NOTE — Progress Notes (Signed)
Discontinue Kelfex and start macrobid. See My chart message 09/18/18

## 2018-09-21 ENCOUNTER — Ambulatory Visit (HOSPITAL_COMMUNITY)
Admission: RE | Admit: 2018-09-21 | Discharge: 2018-09-21 | Disposition: A | Discharge status: Home / Self Care | Payer: Medicare Other | Source: Ambulatory Visit | Attending: Family Medicine | Admitting: Family Medicine

## 2018-09-21 ENCOUNTER — Other Ambulatory Visit: Payer: Self-pay

## 2018-09-21 DIAGNOSIS — K769 Liver disease, unspecified: Secondary | ICD-10-CM

## 2018-09-27 ENCOUNTER — Telehealth: Payer: Self-pay

## 2018-09-27 NOTE — Telephone Encounter (Signed)

## 2018-09-28 ENCOUNTER — Ambulatory Visit: Payer: Medicare Other | Admitting: Family Medicine

## 2018-09-28 ENCOUNTER — Other Ambulatory Visit: Payer: Self-pay

## 2018-09-28 ENCOUNTER — Ambulatory Visit (HOSPITAL_COMMUNITY)
Admission: RE | Admit: 2018-09-28 | Discharge: 2018-09-28 | Disposition: A | Discharge status: Home / Self Care | Payer: Medicare Other | Source: Ambulatory Visit | Attending: Family Medicine | Admitting: Family Medicine

## 2018-09-28 DIAGNOSIS — K769 Liver disease, unspecified: Secondary | ICD-10-CM | POA: Diagnosis present

## 2018-09-28 MED ORDER — GADOBUTROL 1 MMOL/ML IV SOLN
7.0000 mL | Freq: Once | INTRAVENOUS | Status: AC | PRN
  Administered 2018-09-28: 7 mL via INTRAVENOUS

## 2018-10-04 ENCOUNTER — Telehealth: Payer: Self-pay

## 2018-10-04 NOTE — Telephone Encounter (Signed)
Called patient to do their pre-visit COVID screening.  Call went to voicemail. Unable to do screening.  

## 2018-10-05 ENCOUNTER — Encounter: Payer: Self-pay | Admitting: Family Medicine

## 2018-10-05 ENCOUNTER — Other Ambulatory Visit: Payer: Self-pay

## 2018-10-05 ENCOUNTER — Ambulatory Visit (INDEPENDENT_AMBULATORY_CARE_PROVIDER_SITE_OTHER): Payer: Medicare Other | Admitting: Family Medicine

## 2018-10-05 VITALS — BP 107/70 | HR 88 | Temp 98.1°F | Resp 17 | Wt 153.4 lb

## 2018-10-05 DIAGNOSIS — K143 Hypertrophy of tongue papillae: Secondary | ICD-10-CM | POA: Diagnosis not present

## 2018-10-05 DIAGNOSIS — R109 Unspecified abdominal pain: Secondary | ICD-10-CM | POA: Diagnosis not present

## 2018-10-05 DIAGNOSIS — R399 Unspecified symptoms and signs involving the genitourinary system: Secondary | ICD-10-CM | POA: Diagnosis not present

## 2018-10-05 DIAGNOSIS — G8929 Other chronic pain: Secondary | ICD-10-CM | POA: Diagnosis not present

## 2018-10-05 LAB — POCT URINALYSIS DIP (CLINITEK)
Bilirubin, UA: NEGATIVE
Glucose, UA: NEGATIVE mg/dL
Ketones, POC UA: NEGATIVE mg/dL
Leukocytes, UA: NEGATIVE
Nitrite, UA: NEGATIVE
POC PROTEIN,UA: NEGATIVE
Spec Grav, UA: 1.025 (ref 1.010–1.025)
Urobilinogen, UA: 0.2 E.U./dL
pH, UA: 6 (ref 5.0–8.0)

## 2018-10-05 MED ORDER — LIDOCAINE VISCOUS HCL 2 % MT SOLN: 15.0000 mL | OROMUCOSAL | 1 refills | Status: DC | PRN

## 2018-10-05 MED ORDER — NYSTATIN 100000 UNIT/ML MT SUSP: 5.0000 mL | Freq: Two times a day (BID) | OROMUCOSAL | 0 refills | Status: DC | PRN

## 2018-10-05 NOTE — Patient Instructions (Addendum)
For treatment of the enlarged papules on your tongue will try the following mouth rinse Pick up lidocaine viscous and nystatin oral solution at pharmacy.   Over-the-counter purchase malolox and liquid diphenhydramine.    Mix solution in a small cup and rinsing mouth twice daily over a period of 1 minute.  Continue to try this remedy over the course of the next 4 weeks.  If no improvement will send you for consult to ear nose and throat specialty.  For abdominal pain ongoing, I have referred you to low [facility] gastroenterology.  Their office will contact you via phone to schedule a follow-up appointment.

## 2018-10-05 NOTE — Progress Notes (Signed)
Patient ID: Diana Townsend, female    DOB: March 18, 1981, 38 y.o.   MRN: [id number]  PCP: Bing Neighbors, FNP  Chief Complaint  Patient presents with  . Other    Telemedicine follow-up     Subjective:  HPI  Diana Townsend is a 38 y.o. female presents for evaluation tongue papillae hypertrophy.   Today's visit:  Tongue Bumps Tongue papillae hypertrophy continued. Attempted relief with a few doses of benadryl.  Benadryl caused severe drowsiness therefore patient continued only after the medicine. Papillae have not increased in size and remain non-painful. Denies pain with swallowing. Denies any recent dental concerns or changes in food types.  UTI Shalini was recently diagnosed and treated for UTI. Asymptomatic at present. Needs urine rechecked. Urine culture collected in ER 09/16/18, was negative for growth.  Social History   Socioeconomic History  . Marital status: Single    Spouse name: Not on file  . Number of children: Not on file  . Years of education: Not on file  . Highest education level: Not on file  Occupational History  . Not on file  Social Needs  . Financial resource strain: Not on file  . Food insecurity:    Worry: Not on file    Inability: Not on file  . Transportation needs:    Medical: Not on file    Non-medical: Not on file  Tobacco Use  . Smoking status: Light Tobacco Smoker    Types: Cigars  . Smokeless tobacco: Never Used  Substance and Sexual Activity  . Alcohol use: Not Currently  . Drug use: Never  . Sexual activity: Not Currently  Lifestyle  . Physical activity:    Days per week: Not on file    Minutes per session: Not on file  . Stress: Not on file  Relationships  . Social connections:    Talks on phone: Not on file    Gets together: Not on file    Attends religious service: Not on file    Active member of club or organization: Not on file    Attends meetings of clubs or organizations: Not on file    Relationship status: Not on file   . Intimate partner violence:    Fear of current or ex partner: Not on file    Emotionally abused: Not on file    Physically abused: Not on file    Forced sexual activity: Not on file  Other Topics Concern  . Not on file  Social History Narrative  . Not on file    Family History  Problem Relation Age of Onset  . Diabetes Mother   . Heart disease Mother   . COPD Father   . Diabetes Father   . Kidney disease Father   . Hypertension Father   . Heart failure Father   . Seizures Father   . Seizures Sister   . Anemia Sister   . Diabetes Maternal Aunt   . Heart disease Maternal Grandfather   . Heart disease Paternal Grandmother    Review of Systems Pertinent negatives listed in HPI Allergies  Allergen Reactions  . Latex Dermatitis    Prior to Admission medications   Medication Sig Start Date End Date Taking? Authorizing Provider  atorvastatin (LIPITOR) 20 MG tablet Take 1 tablet (20 mg total) by mouth daily. Patient not taking: Reported on 09/16/2018 03/10/18   Bing Neighbors, FNP  meloxicam (MOBIC) 15 MG tablet Take 1 tablet (15 mg total) by mouth daily. Patient not  taking: Reported on 09/16/2018 06/22/18   Bing NeighborsHarris, Cyruss Arata S, FNP  mupirocin ointment (BACTROBAN) 2 % Place 1 application into the nose 2 (two) times daily. Patient not taking: Reported on 09/16/2018 07/11/18   Bing NeighborsHarris, Airrion Otting S, FNP  triamcinolone cream (KENALOG) 0.1 % Apply 1 application topically 2 (two) times daily. Patient not taking: Reported on 09/16/2018 03/08/18   Bing NeighborsHarris, Kagan Hietpas S, FNP    Past Medical, Surgical Family and Social History reviewed and updated.    Objective:  There were no vitals filed for this visit.  BP Readings from Last 3 Encounters:  09/16/18 110/63  07/13/18 (!) 102/57  07/11/18 121/79    There were no vitals filed for this visit.     Physical Exam General appearance: alert, well developed, well nourished, cooperative and in no distress Head: Normocephalic, without  obvious abnormality, atraumatic Oropharynx: mildly elevated papillae. Negative oral lesions, dental caries. Oropharynx clear and nonerythematous. Respiratory: Respirations even and unlabored, normal respiratory rate Heart: rate and rhythm normal. No gallop or murmurs noted on exam  Extremities: No gross deformities Skin: Skin color, texture, turgor normal. No rashes seen  Psych: Appropriate mood and affect. Neurologic: Mental status: Alert, oriented to person, place, and time, thought content appropriate.  No results found for: POCGLU  Lab Results  Component Value Date   HGBA1C 5.5 03/08/2018    Assessment & Plan:  1. UTI symptoms -Repeat urine culture   2. Tongue papillae hypertrophy -trial Magic Mouthwash, if no improvement, will refer to ENT   Meds ordered this encounter  Medications  . lidocaine (XYLOCAINE) 2 % solution    Sig: Use as directed 15 mLs in the mouth or throat as needed for mouth pain.    Dispense:  100 mL    Refill:  1  . nystatin (MYCOSTATIN) 100000 UNIT/ML suspension    Sig: Take 5 mLs (500,000 Units total) by mouth 2 (two) times daily as needed.    Dispense:  60 mL    Refill:  0    Joaquin CourtsKimberly Alani Lacivita, FNP [facility] [address], [place] [id number] [phone]: [phone]

## 2018-10-07 ENCOUNTER — Ambulatory Visit
Admission: RE | Admit: 2018-10-07 | Discharge: 2018-10-07 | Disposition: A | Discharge status: Home / Self Care | Payer: Medicare Other | Source: Ambulatory Visit | Attending: Family Medicine | Admitting: Family Medicine

## 2018-10-07 ENCOUNTER — Other Ambulatory Visit: Payer: Self-pay

## 2018-10-07 DIAGNOSIS — Z1231 Encounter for screening mammogram for malignant neoplasm of breast: Secondary | ICD-10-CM

## 2018-10-10 ENCOUNTER — Other Ambulatory Visit: Payer: Self-pay | Admitting: Family Medicine

## 2018-10-10 DIAGNOSIS — R928 Other abnormal and inconclusive findings on diagnostic imaging of breast: Secondary | ICD-10-CM

## 2018-10-12 ENCOUNTER — Ambulatory Visit: Payer: Medicare Other | Admitting: Podiatry

## 2018-10-14 ENCOUNTER — Other Ambulatory Visit: Payer: Self-pay | Admitting: Family Medicine

## 2018-10-14 ENCOUNTER — Other Ambulatory Visit: Payer: Self-pay

## 2018-10-14 ENCOUNTER — Ambulatory Visit
Admission: RE | Admit: 2018-10-14 | Discharge: 2018-10-14 | Disposition: A | Discharge status: Home / Self Care | Payer: Medicare Other | Source: Ambulatory Visit | Attending: Family Medicine | Admitting: Family Medicine

## 2018-10-14 DIAGNOSIS — R928 Other abnormal and inconclusive findings on diagnostic imaging of breast: Secondary | ICD-10-CM

## 2018-10-14 DIAGNOSIS — N6489 Other specified disorders of breast: Secondary | ICD-10-CM

## 2018-10-18 ENCOUNTER — Other Ambulatory Visit: Payer: Self-pay

## 2018-10-19 ENCOUNTER — Other Ambulatory Visit: Payer: Self-pay

## 2018-10-19 ENCOUNTER — Ambulatory Visit (INDEPENDENT_AMBULATORY_CARE_PROVIDER_SITE_OTHER): Payer: Medicare Other | Admitting: Gastroenterology

## 2018-10-19 ENCOUNTER — Encounter: Payer: Self-pay | Admitting: Gastroenterology

## 2018-10-19 DIAGNOSIS — R1084 Generalized abdominal pain: Secondary | ICD-10-CM | POA: Diagnosis not present

## 2018-10-19 MED ORDER — DICYCLOMINE HCL 10 MG PO CAPS: 10.0000 mg | ORAL_CAPSULE | Freq: Two times a day (BID) | ORAL | 0 refills | Status: DC | PRN

## 2018-10-19 NOTE — Progress Notes (Signed)
This patient contacted our office requesting a physician telemedicine consultation regarding clinical questions and/or test results.  If new patient, they were referred by PCP below  Participants on the conference : myself and patient   The patient consented to this consultation and was aware that a charge will be placed through their insurance.  I was in my office and the patient was at home   Encounter time:  Total time 30 minutes, with 24 minutes spent with patient on doximity   _____________________________________________________________________________________________              [facility] Consult Note:  History: Diana Townsend 10/19/2018  Referring provider: Scot Jun, FNP  Reason for consult/chief complaint: chronic abdominal pain  Subjective  HPI: This is a 38 year old woman referred by primary care for many years of chronic abdominal pain.  She reports having abdominal pain since she was an adolescent.  She would have intermittent sharp abdominal pain that would occur in various locations, usually a minute or 2, always sharp and usually nonradiating.  It would sometimes double her over, and then resolve as quickly as it came.  She had occasionally tended toward constipation, does not recall having chronic diarrhea in the past. We have no prior records for review, but she reports having an upper endoscopy and colonoscopy at some point in the past, recalls that they were normal and she tested negative for H. pylori.  About 2014 she was living in [place] and had ongoing work-up for this, says multiple scans were done.  She ultimately had what sounds like a HIDA scan which led to cholecystectomy but no improvement in this pain.  She cannot recall the name of any medicines that she had been prescribed, believes the least 1 of them was something "you see on TV".  She denies rectal bleeding.  She denies nausea, vomiting, early satiety, dysphagia or  weight loss.  This problem has behaved the same over many years.   ROS:  Review of Systems She denies chest pain dyspnea or dysuria Depression and PTSD Remainder of systems negative except as above  Past Medical History: Past Medical History:  Diagnosis Date  . Anxiety   . Depression   . Low blood pressure   . PTSD (post-traumatic stress disorder)   . Vertigo    Emergency department visit for an episode of the same pain in February of this year.  She says it was so intense she nearly took an ambulance, she left without being seen in the ED, because pain resolved "after about 20 minutes".  She had a recent emergency department visit for suspected UTI or pelvic yeast infection.  Past Surgical History: Past Surgical History:  Procedure Laterality Date  . CARPAL TUNNEL RELEASE Bilateral   . CHOLECYSTECTOMY       Family History: Family History  Problem Relation Age of Onset  . Diabetes Mother   . Heart disease Mother   . COPD Father   . Diabetes Father   . Kidney disease Father   . Hypertension Father   . Heart failure Father   . Seizures Father   . Seizures Sister   . Anemia Sister   . Diabetes Maternal Aunt   . Heart disease Maternal Grandfather   . Heart disease Paternal Grandmother   . Breast cancer Cousin 11       mat cousin  . Colon cancer Neg Hx   . Stomach cancer Neg Hx   . Esophageal cancer Neg Hx   .  Pancreatic cancer Neg Hx     Social History: Social History   Socioeconomic History  . Marital status: Single    Spouse name: Not on file  . Number of children: Not on file  . Years of education: Not on file  . Highest education level: Not on file  Occupational History  . Not on file  Social Needs  . Financial resource strain: Not on file  . Food insecurity    Worry: Not on file    Inability: Not on file  . Transportation needs    Medical: Not on file    Non-medical: Not on file  Tobacco Use  . Smoking status: Light Tobacco Smoker    Types:  Cigars  . Smokeless tobacco: Never Used  Substance and Sexual Activity  . Alcohol use: Not Currently  . Drug use: Never  . Sexual activity: Not Currently  Lifestyle  . Physical activity    Days per week: Not on file    Minutes per session: Not on file  . Stress: Not on file  Relationships  . Social Musicianconnections    Talks on phone: Not on file    Gets together: Not on file    Attends religious service: Not on file    Active member of club or organization: Not on file    Attends meetings of clubs or organizations: Not on file    Relationship status: Not on file  Other Topics Concern  . Not on file  Social History Narrative  . Not on file    Allergies: Allergies  Allergen Reactions  . Latex Dermatitis    Outpatient Meds: Current Outpatient Medications  Medication Sig Dispense Refill  . dicyclomine (BENTYL) 10 MG capsule Take 1 capsule (10 mg total) by mouth 2 (two) times daily as needed for spasms. 30 capsule 0  . lidocaine (XYLOCAINE) 2 % solution Use as directed 15 mLs in the mouth or throat as needed for mouth pain. 100 mL 1  . meloxicam (MOBIC) 15 MG tablet Take 1 tablet (15 mg total) by mouth daily. 30 tablet 3  . mupirocin ointment (BACTROBAN) 2 % Place 1 application into the nose 2 (two) times daily. 22 g 2  . nystatin (MYCOSTATIN) 100000 UNIT/ML suspension Take 5 mLs (500,000 Units total) by mouth 2 (two) times daily as needed. 60 mL 0   No current facility-administered medications for this visit.     Takes "herbal laxative"  Says stool formed and regular on that ___________________________________________________________________ Objective   Exam:  No exam - virtual visit Well-appearing on exam   Assessment: Encounter Diagnosis  Name Primary?  . Generalized abdominal pain Yes    This patient has many years of chronic stable episodic, brief sharp abdominal pain that sounds likely to be functional.  She has a reported longstanding unrevealing history, no  improvement after cholecystectomy.  Plan:  Dicyclomine 10 mg was prescribed to take as needed, but only for an episode that last longer than about 10 minutes.  OTC therapies such as IB Delene RuffiniGard could be tried as well.  No further testing planned at present, see me as needed.  Thank you for the courtesy of this consult.  Please call me with any questions or concerns.  Charlie PitterHenry L Danis III  CC: Referring provider noted above

## 2018-10-28 DIAGNOSIS — L281 Prurigo nodularis: Secondary | ICD-10-CM | POA: Diagnosis not present

## 2018-11-29 ENCOUNTER — Ambulatory Visit: Payer: Medicare Other | Admitting: Sports Medicine

## 2019-01-11 ENCOUNTER — Other Ambulatory Visit: Payer: Self-pay

## 2019-01-11 MED ORDER — MELOXICAM 15 MG PO TABS: 15.0000 mg | ORAL_TABLET | Freq: Every day | ORAL | 0 refills | Status: DC

## 2019-02-01 ENCOUNTER — Encounter: Payer: Self-pay | Admitting: Emergency Medicine

## 2019-02-01 ENCOUNTER — Ambulatory Visit
Admission: EM | Admit: 2019-02-01 | Discharge: 2019-02-01 | Disposition: A | Discharge status: Home / Self Care | Payer: Medicare Other | Attending: Emergency Medicine | Admitting: Emergency Medicine

## 2019-02-01 ENCOUNTER — Encounter: Payer: Self-pay | Admitting: Nurse Practitioner

## 2019-02-01 ENCOUNTER — Other Ambulatory Visit: Payer: Self-pay

## 2019-02-01 DIAGNOSIS — B9689 Other specified bacterial agents as the cause of diseases classified elsewhere: Secondary | ICD-10-CM

## 2019-02-01 DIAGNOSIS — N898 Other specified noninflammatory disorders of vagina: Secondary | ICD-10-CM

## 2019-02-01 DIAGNOSIS — N3001 Acute cystitis with hematuria: Secondary | ICD-10-CM | POA: Diagnosis present

## 2019-02-01 DIAGNOSIS — N946 Dysmenorrhea, unspecified: Secondary | ICD-10-CM

## 2019-02-01 LAB — POCT URINALYSIS DIP (MANUAL ENTRY)
Bilirubin, UA: NEGATIVE
Glucose, UA: NEGATIVE mg/dL
Ketones, POC UA: NEGATIVE mg/dL
Nitrite, UA: POSITIVE — AB
Protein Ur, POC: >=300 mg/dL — AB
Spec Grav, UA: 1.02 (ref 1.010–1.025)
Urobilinogen, UA: 0.2 E.U./dL
pH, UA: 7.5 (ref 5.0–8.0)

## 2019-02-01 LAB — POCT URINE PREGNANCY: Preg Test, Ur: NEGATIVE

## 2019-02-01 MED ORDER — CEPHALEXIN 500 MG PO CAPS: 500.0000 mg | ORAL_CAPSULE | Freq: Two times a day (BID) | ORAL | 0 refills | Status: AC

## 2019-02-01 MED ORDER — FLUCONAZOLE 200 MG PO TABS: 200.0000 mg | ORAL_TABLET | Freq: Once | ORAL | 0 refills | Status: AC

## 2019-02-01 NOTE — ED Provider Notes (Signed)
[facility]    CSN: [id number] Arrival date & time: 02/01/19  1021      History   Chief Complaint Chief Complaint  Patient presents with  . Dysuria    HPI Diana Townsend is a 38 y.o. female with history of hypertension, anxiety presenting for urinary urgency, frequency, burning sensation over the last 24 hours.  Patient states "it feels like my last UTI ".  Patient denies frequent UTIs.  Patient also noting increase in vaginal discharge x2 days.  States she had unprotected intercourse with a new female partner last week.  Denies pelvic, vaginal pain, any genital lesions.  Patient does endorse history of yeast infection s/p antibiotic use. Patient also notes increase in menses: 2 periods this month.  Denies personal family history of fibroids, endometriosis.  No dyspareunia, pelvic pain, discharge GI.  Denies history of anemia, lightheadedness, weakness, fatigue.  Not discussed this with her PCP.  States she has an appointment with them in 2 to 3 weeks.  Past Medical History:  Diagnosis Date  . Anxiety   . Depression   . Low blood pressure   . PTSD (post-traumatic stress disorder)   . Vertigo     There are no active problems to display for this patient.   Past Surgical History:  Procedure Laterality Date  . CARPAL TUNNEL RELEASE Bilateral   . CHOLECYSTECTOMY      OB History   No obstetric history on file.      Home Medications    Prior to Admission medications   Medication Sig Start Date End Date Taking? Authorizing Provider  cephALEXin (KEFLEX) 500 MG capsule Take 1 capsule (500 mg total) by mouth 2 (two) times daily for 3 days. FOR UTI 02/01/19 02/04/19  Hall-Potvin, [place], PA-C  fluconazole (DIFLUCAN) 200 MG tablet Take 1 tablet (200 mg total) by mouth once for 1 dose. May repeat in 72 hours if needed 02/01/19 02/01/19  Hall-Potvin, [place], PA-C  dicyclomine (BENTYL) 10 MG capsule Take 1 capsule (10 mg total) by mouth 2 (two) times daily as needed for  spasms. 10/19/18 02/01/19  Doran Stabler, MD    Family History Family History  Problem Relation Age of Onset  . Diabetes Mother   . Heart disease Mother   . COPD Father   . Diabetes Father   . Kidney disease Father   . Hypertension Father   . Heart failure Father   . Seizures Father   . Seizures Sister   . Anemia Sister   . Diabetes Maternal Aunt   . Heart disease Maternal Grandfather   . Heart disease Paternal Grandmother   . Breast cancer Cousin 48       mat cousin  . Colon cancer Neg Hx   . Stomach cancer Neg Hx   . Esophageal cancer Neg Hx   . Pancreatic cancer Neg Hx     Social History Social History   Tobacco Use  . Smoking status: Light Tobacco Smoker    Types: Cigars  . Smokeless tobacco: Never Used  Substance Use Topics  . Alcohol use: Not Currently  . Drug use: Never     Allergies   Latex   Review of Systems Review of Systems  Constitutional: Negative for fatigue and fever.  Respiratory: Negative for cough and shortness of breath.   Cardiovascular: Negative for chest pain and palpitations.  Gastrointestinal: Negative for constipation and diarrhea.  Genitourinary: Positive for dysuria, frequency, urgency and vaginal discharge. Negative for flank pain,  hematuria, pelvic pain, vaginal bleeding and vaginal pain.     Physical Exam Triage Vital Signs ED Triage Vitals  Enc Vitals Group     BP 02/01/19 1027 95/66     Pulse Rate 02/01/19 1027 95     Resp 02/01/19 1027 18     Temp 02/01/19 1027 98.4 F (36.9 C)     Temp Source 02/01/19 1027 Oral     SpO2 02/01/19 1027 97 %     Weight --      Height --      Head Circumference --      Peak Flow --      Pain Score 02/01/19 1028 2     Pain Loc --      Pain Edu? --      Excl. in GC? --    No data found.  Updated Vital Signs BP 95/66 (BP Location: Left Arm)   Pulse 95   Temp 98.4 F (36.9 C) (Oral)   Resp 18   LMP 01/18/2019   SpO2 97%   Visual Acuity Right Eye Distance:   Left Eye  Distance:   Bilateral Distance:    Right Eye Near:   Left Eye Near:    Bilateral Near:     Physical Exam Constitutional:      General: She is not in acute distress. HENT:     Head: Normocephalic and atraumatic.  Eyes:     General: No scleral icterus.    Pupils: Pupils are equal, round, and reactive to light.  Cardiovascular:     Rate and Rhythm: Normal rate.  Pulmonary:     Effort: Pulmonary effort is normal.  Abdominal:     General: Bowel sounds are normal.     Palpations: Abdomen is soft.     Tenderness: There is no abdominal tenderness. There is no right CVA tenderness, left CVA tenderness or guarding.  Genitourinary:    Comments: Patient declined, self-swab performed Skin:    Coloration: Skin is not jaundiced or pale.  Neurological:     Mental Status: She is alert and oriented to person, place, and time.      UC Treatments / Results  Labs (all labs ordered are listed, but only abnormal results are displayed) Labs Reviewed  POCT URINALYSIS DIP (MANUAL ENTRY) - Abnormal; Notable for the following components:      Result Value   Clarity, UA cloudy (*)    Blood, UA large (*)    Protein Ur, POC >=300 (*)    Nitrite, UA Positive (*)    Leukocytes, UA Large (3+) (*)    All other components within normal limits  POCT URINE PREGNANCY - Normal  URINE CULTURE  CERVICOVAGINAL ANCILLARY ONLY    EKG   Radiology No results found.  Procedures Procedures (including critical care time)  Medications Ordered in UC Medications - No data to display  Initial Impression / Assessment and Plan / UC Course  I have reviewed the triage vital signs and the nursing notes.  Pertinent labs & imaging results that were available during my care of the patient were reviewed by me and considered in my medical decision making (see chart for details).     1.  Acute cystitis with hematuria POCT urine dipstick done in office, reviewed by me: Center for leukocytes, nitrates, protein,  blood.  Will initiate Keflex today, provided Diflucan for yeast infection s/p antibiotic use.  2.  Vaginal discharge STI panel pending, will wait for results prior to  treating as patient denies known exposure.  3.  Dysmenorrhea Reviewed how to keep track of symptoms and menstrual tracking app as outlined below.  Patient to do so until her appoint with PCP next month.  Return precautions discussed, patient verbalized understanding and is agreeable to plan. Final Clinical Impressions(s) / UC Diagnoses   Final diagnoses:  Acute cystitis with hematuria     Discharge Instructions     Recommend the download "Flo" to track year menstrual cycle -bring this to your PCP appointment in October to review. Take antibiotic as prescribed with food. STD panel pending: We will call you if positive, and treat appropriately if needed.     ED Prescriptions    Medication Sig Dispense Auth. Provider   cephALEXin (KEFLEX) 500 MG capsule Take 1 capsule (500 mg total) by mouth 2 (two) times daily for 3 days. FOR UTI 6 capsule Hall-Potvin, [place], PA-C   fluconazole (DIFLUCAN) 200 MG tablet Take 1 tablet (200 mg total) by mouth once for 1 dose. May repeat in 72 hours if needed 2 tablet Hall-Potvin, [place], PA-C     PDMP not reviewed this encounter.   Hall-Potvin, [place], [place] 02/01/19 1141

## 2019-02-01 NOTE — Progress Notes (Signed)
Patient requested a form for ESA to be filled out for her housing manager that was faxed and transmitted to our office on 01-25-2019. She also had a letter from her PCP dated 06-22-2018 that stated patient required an ESA for chronic conditions. On the form that patient completed herself she stated that she required A PUPPY for her PTSD.  I completed the form as requested for submission today 02-01-2019. Patient came into the office today stating she needed the form revised and a new letter stating that she now has PUPPIES and needs the form to state such. I explained to patient that I would not be able to approve multiple puppies as it appears she now has more animals since February and if she needed multiple ESAs this would have to be referred to a therapist or a psychiatrist. Unfortunately it does not appear that she takes any or has ever taken any medication for PTSD or Depression, nor has her PCP referred her at any time to a therapist to manage her PTSD/Depression. I also could not locate any prior office notes detailing her PTSD or therapy between her PCP and herself. As I am not able to certify she requires multiple ESAs she will need to be referred to psychiatry or a therapist to support this request. Ms. Mccalister was very upset and stated she is going to be kicked out of her apartment if she does not get the letter. I even offered to call her office manager and explain that we would need additional time to support the need of multiple ESAs and patient adamantly refused to allow me to call. Stating they won't give me any time. I even offered to send a letter requesting a 30 day grace period and patient declined that as well. She states she is going to file a complaint with the clinic as well as [employer]. I apologized to Ms. Borchard for any inconvenience as I was leaving to return to my office.

## 2019-02-01 NOTE — ED Notes (Signed)
Patient able to ambulate independently  

## 2019-02-01 NOTE — Discharge Instructions (Addendum)
Recommend the download "Flo" to track year menstrual cycle -bring this to your PCP appointment in October to review. Take antibiotic as prescribed with food. STD panel pending: We will call you if positive, and treat appropriately if needed.

## 2019-02-01 NOTE — ED Triage Notes (Signed)
Patient presents to [facility] for assessment of urinary urgency and frequency with a tingling sensation after urination x 24 hours.  Denies fevers/chills.  Denies blood.  Denies vaginal discharge.

## 2019-02-02 LAB — CERVICOVAGINAL ANCILLARY ONLY
Bacterial Vaginitis (gardnerella): NEGATIVE
Candida Glabrata: NEGATIVE
Candida Vaginitis: NEGATIVE
Chlamydia: NEGATIVE
Molecular Disclaimer: NEGATIVE
Molecular Disclaimer: NEGATIVE
Molecular Disclaimer: NEGATIVE
Molecular Disclaimer: NEGATIVE
Molecular Disclaimer: NORMAL
Molecular Disclaimer: NORMAL
Neisseria Gonorrhea: NEGATIVE
Trichomonas: NEGATIVE

## 2019-02-03 ENCOUNTER — Telehealth (HOSPITAL_COMMUNITY): Payer: Self-pay | Admitting: Emergency Medicine

## 2019-02-03 LAB — URINE CULTURE: Culture: >=100000 — AB

## 2019-02-03 NOTE — Telephone Encounter (Signed)
Urine culture was positive for e coli and was given keflex at urgent care visit. Pt contacted and made aware, educated on completing antibiotic and to follow up if symptoms are persistent. Verbalized understanding.    

## 2019-03-21 ENCOUNTER — Other Ambulatory Visit: Payer: Self-pay | Admitting: Cardiology

## 2019-03-21 DIAGNOSIS — Z20822 Contact with and (suspected) exposure to covid-19: Secondary | ICD-10-CM

## 2019-03-23 LAB — NOVEL CORONAVIRUS, NAA: SARS-CoV-2, NAA: NOT DETECTED

## 2019-04-19 ENCOUNTER — Ambulatory Visit
Admission: RE | Admit: 2019-04-19 | Discharge: 2019-04-19 | Disposition: A | Discharge status: Home / Self Care | Payer: Medicare Other | Source: Ambulatory Visit | Attending: Family Medicine | Admitting: Family Medicine

## 2019-04-19 ENCOUNTER — Other Ambulatory Visit: Payer: Self-pay

## 2019-04-19 ENCOUNTER — Other Ambulatory Visit: Payer: Self-pay | Admitting: Nurse Practitioner

## 2019-04-19 DIAGNOSIS — N6489 Other specified disorders of breast: Secondary | ICD-10-CM

## 2019-05-15 ENCOUNTER — Other Ambulatory Visit: Payer: Self-pay

## 2019-05-15 ENCOUNTER — Encounter (HOSPITAL_COMMUNITY): Payer: Self-pay | Admitting: Emergency Medicine

## 2019-05-15 ENCOUNTER — Emergency Department (HOSPITAL_COMMUNITY)
Admission: EM | Admit: 2019-05-15 | Discharge: 2019-05-15 | Disposition: A | Discharge status: Home / Self Care | Payer: Medicare Other | Attending: Emergency Medicine | Admitting: Emergency Medicine

## 2019-05-15 DIAGNOSIS — R102 Pelvic and perineal pain: Secondary | ICD-10-CM | POA: Insufficient documentation

## 2019-05-15 DIAGNOSIS — F17290 Nicotine dependence, other tobacco product, uncomplicated: Secondary | ICD-10-CM | POA: Diagnosis not present

## 2019-05-15 DIAGNOSIS — N941 Unspecified dyspareunia: Secondary | ICD-10-CM | POA: Diagnosis not present

## 2019-05-15 DIAGNOSIS — B9689 Other specified bacterial agents as the cause of diseases classified elsewhere: Secondary | ICD-10-CM

## 2019-05-15 DIAGNOSIS — Z91040 Latex allergy status: Secondary | ICD-10-CM | POA: Diagnosis not present

## 2019-05-15 DIAGNOSIS — N939 Abnormal uterine and vaginal bleeding, unspecified: Secondary | ICD-10-CM | POA: Diagnosis present

## 2019-05-15 DIAGNOSIS — N760 Acute vaginitis: Secondary | ICD-10-CM | POA: Diagnosis not present

## 2019-05-15 LAB — I-STAT BETA HCG BLOOD, ED (MC, WL, AP ONLY): I-stat hCG, quantitative: <5 m[IU]/mL (ref <5)

## 2019-05-15 LAB — URINALYSIS, ROUTINE W REFLEX MICROSCOPIC
Bacteria, UA: NONE SEEN
Bilirubin Urine: NEGATIVE
Glucose, UA: NEGATIVE mg/dL
Ketones, ur: NEGATIVE mg/dL
Leukocytes,Ua: NEGATIVE
Nitrite: NEGATIVE
Protein, ur: NEGATIVE mg/dL
Specific Gravity, Urine: 1.026 (ref 1.005–1.030)
pH: 5 (ref 5.0–8.0)

## 2019-05-15 LAB — WET PREP, GENITAL
Sperm: NONE SEEN
Trich, Wet Prep: NONE SEEN
WBC, Wet Prep HPF POC: NONE SEEN
Yeast Wet Prep HPF POC: NONE SEEN

## 2019-05-15 LAB — RPR: RPR Ser Ql: NONREACTIVE

## 2019-05-15 LAB — HIV ANTIBODY (ROUTINE TESTING W REFLEX): HIV Screen 4th Generation wRfx: NONREACTIVE

## 2019-05-15 MED ORDER — METRONIDAZOLE 0.75 % VA GEL: 1.0000 | Freq: Two times a day (BID) | VAGINAL | 0 refills | Status: DC

## 2019-05-15 NOTE — ED Triage Notes (Signed)
PT reports having intercourse several weeks ago during ovulation and then had light spotting. Pt concerned for possible pregnancy or STD.

## 2019-05-15 NOTE — ED Provider Notes (Signed)
[facility]-EMERGENCY DEPT Provider Note   CSN: [id number] Arrival date & time: 05/15/19  0205     History Chief Complaint  Patient presents with  . Exposure to STD    Diana Townsend is a 39 y.o. female.  Patient to ED with concern for painful intercourse and light, brief vaginal bleeding following sex. Symptoms first started on 12/31. Her regular menses due and started on 05/06/19 and the post-intercourse bleeding stopped prior to her menses. Pain is also brief in duration. No vaginal discharge, abdominal pain, nausea. No urinary symptoms. She has no bleeding currently and no pain.    Exposure to STD Pertinent negatives include no abdominal pain.       Past Medical History:  Diagnosis Date  . Anxiety   . Depression   . Low blood pressure   . PTSD (post-traumatic stress disorder)   . Vertigo     There are no problems to display for this patient.   Past Surgical History:  Procedure Laterality Date  . CARPAL TUNNEL RELEASE Bilateral   . CHOLECYSTECTOMY       OB History   No obstetric history on file.     Family History  Problem Relation Age of Onset  . Diabetes Mother   . Heart disease Mother   . COPD Father   . Diabetes Father   . Kidney disease Father   . Hypertension Father   . Heart failure Father   . Seizures Father   . Seizures Sister   . Anemia Sister   . Diabetes Maternal Aunt   . Heart disease Maternal Grandfather   . Heart disease Paternal Grandmother   . Breast cancer Cousin 40       mat cousin  . Colon cancer Neg Hx   . Stomach cancer Neg Hx   . Esophageal cancer Neg Hx   . Pancreatic cancer Neg Hx     Social History   Tobacco Use  . Smoking status: Light Tobacco Smoker    Types: Cigars  . Smokeless tobacco: Never Used  Substance Use Topics  . Alcohol use: Not Currently  . Drug use: Never    Home Medications Prior to Admission medications   Medication Sig Start Date End Date Taking? Authorizing Provider    dicyclomine (BENTYL) 10 MG capsule Take 1 capsule (10 mg total) by mouth 2 (two) times daily as needed for spasms. 10/19/18 02/01/19  Sherrilyn Rist, MD    Allergies    Latex  Review of Systems   Review of Systems  Constitutional: Negative for fever.  Gastrointestinal: Negative for abdominal pain, nausea and vomiting.  Genitourinary: Positive for dyspareunia and vaginal bleeding (See HPI.). Negative for dysuria.  Musculoskeletal: Negative for back pain.    Physical Exam Updated Vital Signs BP 122/72 (BP Location: Right Arm)   Pulse (!) 104   Temp 98.6 F (37 C) (Oral)   Resp 15   Ht 5\' 4"  (1.626 m)   Wt 72.6 kg   SpO2 99%   BMI 27.46 kg/m   Physical Exam Vitals and nursing note reviewed.  Constitutional:      Appearance: She is well-developed.  Pulmonary:     Effort: Pulmonary effort is normal.  Genitourinary:    Vagina: No signs of injury and foreign body. No vaginal discharge or tenderness.     Cervix: No cervical motion tenderness, discharge, friability, lesion, erythema or cervical bleeding.     Uterus: Not tender.  Adnexa:        Right: No tenderness.         Left: No tenderness.    Musculoskeletal:     Cervical back: Normal range of motion.  Skin:    General: Skin is warm and dry.  Neurological:     Mental Status: She is alert and oriented to person, place, and time.     ED Results / Procedures / Treatments   Labs (all labs ordered are listed, but only abnormal results are displayed) Labs Reviewed - No data to display  EKG None  Radiology No results found.  Procedures Procedures (including critical care time)  Medications Ordered in ED Medications - No data to display  ED Course  I have reviewed the triage vital signs and the nursing notes.  Pertinent labs & imaging results that were available during my care of the patient were reviewed by me and considered in my medical decision making (see chart for details).    MDM  Rules/Calculators/A&P                      Patient to ED with concerns as detailed in HPI.  She is very well appearing. VSS. Pelvic exam is unremarkable - no discharge, tenderness or bleeding. Labs reassuring. She has BV but this is not felt to cause her presenting symptoms. Patient reassured. She does have hematuria and is encouraged to see her doctor to have rechecked in 7-10 days.    Final Clinical Impression(s) / ED Diagnoses Final diagnoses:  None   1. BV 2. Microscopic hematuria 3. Mild dyspareunia  Rx / DC Orders ED Discharge Orders    None       Charlann Lange, PA-C 05/15/19 0533    Mesner, Corene Cornea, MD 05/15/19 [phone]

## 2019-05-15 NOTE — Discharge Instructions (Addendum)
Use the Metrogel as directed for treatment of bacterial vaginosis.  Please follow up with your doctor in 7-10 days to have your symptoms of discomfort related to sexual intercourse, and to have your urine rechecked for microscopic blood.

## 2019-05-16 LAB — GC/CHLAMYDIA PROBE AMP (~~LOC~~) NOT AT ARMC
Chlamydia: NEGATIVE
Neisseria Gonorrhea: NEGATIVE

## 2019-07-28 DIAGNOSIS — N946 Dysmenorrhea, unspecified: Secondary | ICD-10-CM | POA: Diagnosis not present

## 2019-08-07 DIAGNOSIS — J309 Allergic rhinitis, unspecified: Secondary | ICD-10-CM | POA: Diagnosis not present

## 2019-08-07 DIAGNOSIS — K9162 Intraoperative hemorrhage and hematoma of a digestive system organ or structure complicating other procedure: Secondary | ICD-10-CM | POA: Diagnosis not present

## 2019-08-07 DIAGNOSIS — H1012 Acute atopic conjunctivitis, left eye: Secondary | ICD-10-CM | POA: Diagnosis not present

## 2019-08-07 DIAGNOSIS — K660 Peritoneal adhesions (postprocedural) (postinfection): Secondary | ICD-10-CM | POA: Diagnosis not present

## 2019-08-07 DIAGNOSIS — H1013 Acute atopic conjunctivitis, bilateral: Secondary | ICD-10-CM | POA: Diagnosis not present

## 2019-10-18 ENCOUNTER — Other Ambulatory Visit: Payer: Self-pay | Admitting: Family Medicine

## 2019-10-18 ENCOUNTER — Other Ambulatory Visit: Payer: Self-pay | Admitting: Physician Assistant

## 2019-10-18 ENCOUNTER — Other Ambulatory Visit: Payer: Self-pay | Admitting: Obstetrics and Gynecology

## 2019-10-18 ENCOUNTER — Other Ambulatory Visit: Payer: Self-pay

## 2019-10-18 ENCOUNTER — Ambulatory Visit
Admission: RE | Admit: 2019-10-18 | Discharge: 2019-10-18 | Disposition: A | Discharge status: Home / Self Care | Payer: Medicare Other | Source: Ambulatory Visit | Attending: Nurse Practitioner | Admitting: Nurse Practitioner

## 2019-10-18 DIAGNOSIS — N6489 Other specified disorders of breast: Secondary | ICD-10-CM

## 2019-10-18 DIAGNOSIS — R928 Other abnormal and inconclusive findings on diagnostic imaging of breast: Secondary | ICD-10-CM | POA: Diagnosis not present

## 2019-11-24 ENCOUNTER — Other Ambulatory Visit: Payer: Self-pay

## 2019-11-24 ENCOUNTER — Emergency Department (HOSPITAL_COMMUNITY)
Admission: EM | Admit: 2019-11-24 | Discharge: 2019-11-24 | Disposition: A | Discharge status: Home / Self Care | Payer: Medicare Other | Attending: Emergency Medicine | Admitting: Emergency Medicine

## 2019-11-24 DIAGNOSIS — R6889 Other general symptoms and signs: Secondary | ICD-10-CM | POA: Diagnosis not present

## 2019-11-24 DIAGNOSIS — J302 Other seasonal allergic rhinitis: Secondary | ICD-10-CM | POA: Diagnosis not present

## 2019-11-24 DIAGNOSIS — F17200 Nicotine dependence, unspecified, uncomplicated: Secondary | ICD-10-CM | POA: Insufficient documentation

## 2019-11-24 DIAGNOSIS — H9209 Otalgia, unspecified ear: Secondary | ICD-10-CM | POA: Diagnosis present

## 2019-11-24 MED ORDER — LORATADINE 10 MG PO TABS: 10.0000 mg | ORAL_TABLET | Freq: Every day | ORAL | 1 refills | Status: DC

## 2019-11-24 NOTE — ED Provider Notes (Signed)
[facility]-EMERGENCY DEPT Provider Note   CSN: [id number] Arrival date & time: 11/24/19  1459     History No chief complaint on file.   Diana Townsend is a 39 y.o. female.  HPI Patient is a 39 year old female with a medical history as noted below.  She presents today with multiple complaints.  Patient states for the last 4 to 5 months she has been experiencing increased lacrimation of the eyes.  Last night she began experiencing right ear pressure with mild pain, head pressure in the frontal region as well as right-sided dental pain.  She denies a known history of seasonal allergies but states that she has taken Claritin in the past.  Patient states she sleeps with a fan in an open window at night.  No fevers, chills, chest pain, shortness of breath.    Past Medical History:  Diagnosis Date  . Anxiety   . Depression   . Low blood pressure   . PTSD (post-traumatic stress disorder)   . Vertigo     There are no problems to display for this patient.   Past Surgical History:  Procedure Laterality Date  . CARPAL TUNNEL RELEASE Bilateral   . CHOLECYSTECTOMY       OB History   No obstetric history on file.     Family History  Problem Relation Age of Onset  . Diabetes Mother   . Heart disease Mother   . COPD Father   . Diabetes Father   . Kidney disease Father   . Hypertension Father   . Heart failure Father   . Seizures Father   . Seizures Sister   . Anemia Sister   . Diabetes Maternal Aunt   . Heart disease Maternal Grandfather   . Heart disease Paternal Grandmother   . Breast cancer Cousin 40       mat cousin  . Colon cancer Neg Hx   . Stomach cancer Neg Hx   . Esophageal cancer Neg Hx   . Pancreatic cancer Neg Hx     Social History   Tobacco Use  . Smoking status: Light Tobacco Smoker    Types: Cigars  . Smokeless tobacco: Never Used  Vaping Use  . Vaping Use: Never used  Substance Use Topics  . Alcohol use: Not Currently  .  Drug use: Never    Home Medications Prior to Admission medications   Medication Sig Start Date End Date Taking? Authorizing Provider  metroNIDAZOLE (METROGEL VAGINAL) 0.75 % vaginal gel Place 1 Applicatorful vaginally 2 (two) times daily. 05/15/19   Elpidio Anis, PA-C  dicyclomine (BENTYL) 10 MG capsule Take 1 capsule (10 mg total) by mouth 2 (two) times daily as needed for spasms. 10/19/18 02/01/19  Sherrilyn Rist, MD    Allergies    Latex  Review of Systems   Review of Systems  Constitutional: Negative for chills and fever.  HENT: Positive for congestion, dental problem, ear pain, rhinorrhea, sinus pressure and sinus pain. Negative for sore throat, tinnitus, trouble swallowing and voice change.   Eyes: Positive for discharge. Negative for photophobia, pain, redness and visual disturbance.  Respiratory: Negative for shortness of breath.   Cardiovascular: Negative for chest pain.    Physical Exam Updated Vital Signs BP (!) 131/86 (BP Location: Left Arm)   Pulse 84   Temp 98 F (36.7 C) (Oral)   Resp 18   LMP 11/18/2019   SpO2 100%   Physical Exam Vitals and nursing note reviewed.  Constitutional:      General: She is not in acute distress.    Appearance: Normal appearance. She is normal weight. She is not ill-appearing, toxic-appearing or diaphoretic.  HENT:     Head: Normocephalic and atraumatic.     Comments: No tenderness noted with palpation of the maxillary or frontal sinus.    Right Ear: Tympanic membrane, ear canal and external ear normal. There is no impacted cerumen.     Left Ear: Tympanic membrane, ear canal and external ear normal. There is no impacted cerumen.     Ears:     Comments: EACs are clear bilaterally.  TMs are pearly gray with a good cone of light.    Nose: Congestion present. No rhinorrhea.     Comments: No edema noted of the nasal turbinates bilaterally    Mouth/Throat:     Mouth: Mucous membranes are moist.     Pharynx: Oropharynx is clear.  No oropharyngeal exudate or posterior oropharyngeal erythema.     Comments: Uvula midline.  Patient is readily handling secretions.  No tonsillar hypertrophy.  No erythema noted in the posterior oropharynx.  No tenderness noted with manipulation of the teeth or gingiva in the right mouth. No visible or palpable fluctuance appreciated.  Eyes:     General: No scleral icterus.       Right eye: No discharge.        Left eye: No discharge.     Extraocular Movements: Extraocular movements intact.     Conjunctiva/sclera: Conjunctivae normal.     Pupils: Pupils are equal, round, and reactive to light.  Cardiovascular:     Rate and Rhythm: Normal rate.     Pulses: Normal pulses.  Pulmonary:     Effort: Pulmonary effort is normal.  Abdominal:     General: Abdomen is flat.  Musculoskeletal:        General: Normal range of motion.     Cervical back: Normal range of motion and neck supple. No rigidity or tenderness.  Lymphadenopathy:     Cervical: No cervical adenopathy.  Skin:    General: Skin is warm and dry.     Capillary Refill: Capillary refill takes less than 2 seconds.  Neurological:     General: No focal deficit present.     Mental Status: She is alert and oriented to person, place, and time.  Psychiatric:        Mood and Affect: Mood normal.        Behavior: Behavior normal.    ED Results / Procedures / Treatments   Labs (all labs ordered are listed, but only abnormal results are displayed) Labs Reviewed - No data to display  EKG None  Radiology No results found.  Procedures Procedures   Medications Ordered in ED Medications - No data to display  ED Course  I have reviewed the triage vital signs and the nursing notes.  Pertinent labs & imaging results that were available during my care of the patient were reviewed by me and considered in my medical decision making (see chart for details).    MDM Rules/Calculators/A&P                          Pt is a 39 y.o.  female that present with a history, physical exam, ED Clinical Course as noted above.   Patient presents today with multiple complaints that all appear to be secondary to seasonal allergies.  She is afebrile today.  Not tachycardic.  Not hypoxic.  Patient is experiencing increased congestion, lacrimation, frontal sinus pressure, right ear pain.  Physical exam was reassuring.  We will discharge patient on a course of Claritin.  Recommended initiation of Flonase twice a day.  Patient request to use Benadryl as well.  Recommended Claritin in the a.m. and Benadryl in the p.m.  Follow dosing instructions on the box.  Her questions were answered and she was amicable at the time of discharge.  Patient knows to follow-up with her PCP in 1 week if symptoms have not improved.  Her vital signs are stable the time of discharge.  Patient discharged to home/self care.  Condition at discharge: Stable  Note: Portions of this report may have been transcribed using voice recognition software. Every effort was made to ensure accuracy; however, inadvertent computerized transcription errors may be present.   Final Clinical Impression(s) / ED Diagnoses Final diagnoses:  Seasonal allergies    Rx / DC Orders ED Discharge Orders         Ordered    loratadine (CLARITIN) 10 MG tablet  Daily     Discontinue  Reprint     11/24/19 1614           Placido Sou, PA-C 11/24/19 1616    Raeford Razor, MD 11/25/19 2245

## 2019-11-24 NOTE — ED Triage Notes (Signed)
Patient reports ear pressure, eye tearing up, head pressure, and feeling like she has a sinus infection.

## 2019-11-24 NOTE — ED Notes (Signed)
Pt verbalizes understanding of DC instructions. Pt belongings returned and is ambulatory out of ED.   Signature pad not availble at this time

## 2019-11-24 NOTE — Discharge Instructions (Signed)
I prescribed a medication called Claritin.  This is an antihistamine similar to Benadryl but is not sedating.  You can begin taking this once per day in the morning.  If you feel like your symptoms have not improved you can also try Benadryl in the evening.  Benadryl will make you sleepy.  Do not mix with alcohol.  Also recommend you purchase Flonase.  You can find this medication as well as the cheaper generic version at most major drugstores.  You can use this once to twice a day.  This will help with both your sinus pressure as well as your congestion.  You can take Tylenol and ibuprofen for management of your headaches.  I recommend follow the dosing instructions on the bottle.  Please follow-up with your primary care provider in 1 week if you find your symptoms are not improved.  You can always return to the emergency department with new or worsening symptoms.  It was a pleasure to meet you.

## 2020-02-22 ENCOUNTER — Ambulatory Visit
Admission: EM | Admit: 2020-02-22 | Discharge: 2020-02-22 | Disposition: A | Discharge status: Home / Self Care | Payer: Medicare Other | Attending: Physician Assistant | Admitting: Physician Assistant

## 2020-02-22 ENCOUNTER — Encounter: Payer: Self-pay | Admitting: *Deleted

## 2020-02-22 ENCOUNTER — Other Ambulatory Visit: Payer: Self-pay

## 2020-02-22 DIAGNOSIS — N760 Acute vaginitis: Secondary | ICD-10-CM | POA: Diagnosis present

## 2020-02-22 DIAGNOSIS — K219 Gastro-esophageal reflux disease without esophagitis: Secondary | ICD-10-CM | POA: Insufficient documentation

## 2020-02-22 MED ORDER — FLUCONAZOLE 150 MG PO TABS: 150.0000 mg | ORAL_TABLET | Freq: Every day | ORAL | 0 refills | Status: AC

## 2020-02-22 NOTE — ED Provider Notes (Signed)
[facility]    CSN: [id number] Arrival date & time: 02/22/20  1321      History   Chief Complaint Chief Complaint  Patient presents with  . Vaginal Itching    HPI Diana Townsend is a 39 y.o. female.   39 year old female comes in for 4 day history of vaginal irritation, discharge, itching. Discharge is cottage cheese like. Has multiple new products that  Can cause symptoms. In the past, has used monistat with improvement. States tried [facility] without relief this time. She does have some epigastric pain she wonders if is residual from fibroids surgery. Denies fever, nausea/vomiting.      Past Medical History:  Diagnosis Date  . Anxiety   . Depression   . Low blood pressure   . PTSD (post-traumatic stress disorder)   . Vertigo     There are no problems to display for this patient.   Past Surgical History:  Procedure Laterality Date  . CARPAL TUNNEL RELEASE Bilateral   . CHOLECYSTECTOMY    . UTERINE FIBROID SURGERY      OB History   No obstetric history on file.      Home Medications    Prior to Admission medications   Medication Sig Start Date End Date Taking? Authorizing Provider  fluconazole (DIFLUCAN) 150 MG tablet Take 1 tablet (150 mg total) by mouth daily. Take second dose 72 hours later if symptoms still persists. 02/22/20   Cathie Hoops, Remberto Lienhard V, PA-C  dicyclomine (BENTYL) 10 MG capsule Take 1 capsule (10 mg total) by mouth 2 (two) times daily as needed for spasms. 10/19/18 02/01/19  Sherrilyn Rist, MD  loratadine (CLARITIN) 10 MG tablet Take 1 tablet (10 mg total) by mouth daily. 11/24/19 02/22/20  Placido Sou, PA-C    Family History Family History  Problem Relation Age of Onset  . Diabetes Mother   . Heart disease Mother   . COPD Father   . Diabetes Father   . Kidney disease Father   . Hypertension Father   . Heart failure Father   . Seizures Father   . Seizures Sister   . Anemia Sister   . Diabetes Maternal Aunt   . Heart disease  Maternal Grandfather   . Heart disease Paternal Grandmother   . Breast cancer Cousin 40       mat cousin  . Colon cancer Neg Hx   . Stomach cancer Neg Hx   . Esophageal cancer Neg Hx   . Pancreatic cancer Neg Hx     Social History Social History   Tobacco Use  . Smoking status: Light Tobacco Smoker    Types: Cigars  . Smokeless tobacco: Never Used  . Tobacco comment: Black & Milds  Vaping Use  . Vaping Use: Never used  Substance Use Topics  . Alcohol use: Not Currently  . Drug use: Never     Allergies   Latex   Review of Systems Review of Systems  Reason unable to perform ROS: See HPI as above.     Physical Exam Triage Vital Signs ED Triage Vitals  Enc Vitals Group     BP 02/22/20 1327 98/65     Pulse Rate 02/22/20 1327 89     Resp 02/22/20 1327 16     Temp 02/22/20 1327 98.1 F (36.7 C)     Temp Source 02/22/20 1327 Oral     SpO2 02/22/20 1327 98 %     Weight --  Height --      Head Circumference --      Peak Flow --      Pain Score 02/22/20 1330 0     Pain Loc --      Pain Edu? --      Excl. in GC? --    No data found.  Updated Vital Signs BP 98/65   Pulse 89   Temp 98.1 F (36.7 C) (Oral)   Resp 16   LMP 01/31/2020 (Exact Date)   SpO2 98%   Physical Exam Constitutional:      General: She is not in acute distress.    Appearance: She is well-developed. She is not ill-appearing, toxic-appearing or diaphoretic.  HENT:     Head: Normocephalic and atraumatic.  Eyes:     Conjunctiva/sclera: Conjunctivae normal.     Pupils: Pupils are equal, round, and reactive to light.  Cardiovascular:     Rate and Rhythm: Normal rate and regular rhythm.  Pulmonary:     Effort: Pulmonary effort is normal. No respiratory distress.     Comments: LCTAB Abdominal:     General: Bowel sounds are normal.     Palpations: Abdomen is soft.     Tenderness: There is no abdominal tenderness. There is no right CVA tenderness, left CVA tenderness, guarding or  rebound.  Musculoskeletal:     Cervical back: Normal range of motion and neck supple.  Skin:    General: Skin is warm and dry.  Neurological:     Mental Status: She is alert and oriented to person, place, and time.  Psychiatric:        Behavior: Behavior normal.        Judgment: Judgment normal.      UC Treatments / Results  Labs (all labs ordered are listed, but only abnormal results are displayed) Labs Reviewed - No data to display  EKG   Radiology No results found.  Procedures Procedures (including critical care time)  Medications Ordered in UC Medications - No data to display  Initial Impression / Assessment and Plan / UC Course  I have reviewed the triage vital signs and the nursing notes.  Pertinent labs & imaging results that were available during my care of the patient were reviewed by me and considered in my medical decision making (see chart for details).    1. Yeast vaginitis Diflucan as directed. Patient gets yeast quite frequently, discussed to follow up with PCP/GYN for further evaluation. Return precautions given.  2. GERD/epigastric pain Abdomen soft, +BS, nontender. However, patient can have heartburn symptoms at times depending on activity. Discussed symptomatic management. Return precautions given.  Final Clinical Impressions(s) / UC Diagnoses   Final diagnoses:  Acute vaginitis  Gastroesophageal reflux disease without esophagitis    ED Prescriptions    Medication Sig Dispense Auth. Provider   fluconazole (DIFLUCAN) 150 MG tablet Take 1 tablet (150 mg total) by mouth daily. Take second dose 72 hours later if symptoms still persists. 2 tablet Belinda Fisher, PA-C     PDMP not reviewed this encounter.   Belinda Fisher, PA-C 02/22/20 1354

## 2020-02-22 NOTE — Discharge Instructions (Addendum)
Yeast vaginitis Diflucan as directed. Avoid new hygiene product for now. Monitor for any worsening of symptoms, fever, abdominal pain, nausea, vomiting, to follow up for reevaluation.  GERD/acid reflux Get over the counter pepcid 20mg  and take twice a day for 5-7 days to see how your symptoms are. Follow up with PCP if not improving.

## 2020-02-22 NOTE — ED Triage Notes (Signed)
C/O vaginal irritation, pruritis, discharge x 4 days. Has tried Monistat without relief.

## 2020-02-26 LAB — CERVICOVAGINAL ANCILLARY ONLY
Chlamydia: NEGATIVE
Comment: NEGATIVE
Comment: NEGATIVE
Comment: NORMAL
Neisseria Gonorrhea: NEGATIVE
Trichomonas: NEGATIVE

## 2020-03-30 ENCOUNTER — Encounter (HOSPITAL_COMMUNITY): Payer: Self-pay

## 2020-03-30 ENCOUNTER — Other Ambulatory Visit: Payer: Self-pay

## 2020-03-30 ENCOUNTER — Emergency Department (HOSPITAL_COMMUNITY)
Admission: EM | Admit: 2020-03-30 | Discharge: 2020-03-30 | Disposition: A | Discharge status: Home / Self Care | Payer: Medicare Other | Attending: Emergency Medicine | Admitting: Emergency Medicine

## 2020-03-30 DIAGNOSIS — Z3A01 Less than 8 weeks gestation of pregnancy: Secondary | ICD-10-CM | POA: Diagnosis not present

## 2020-03-30 DIAGNOSIS — N898 Other specified noninflammatory disorders of vagina: Secondary | ICD-10-CM | POA: Insufficient documentation

## 2020-03-30 DIAGNOSIS — O26891 Other specified pregnancy related conditions, first trimester: Secondary | ICD-10-CM | POA: Diagnosis present

## 2020-03-30 DIAGNOSIS — F17290 Nicotine dependence, other tobacco product, uncomplicated: Secondary | ICD-10-CM | POA: Insufficient documentation

## 2020-03-30 DIAGNOSIS — R1030 Lower abdominal pain, unspecified: Secondary | ICD-10-CM | POA: Diagnosis not present

## 2020-03-30 DIAGNOSIS — Z91040 Latex allergy status: Secondary | ICD-10-CM | POA: Diagnosis not present

## 2020-03-30 LAB — URINALYSIS, ROUTINE W REFLEX MICROSCOPIC
Bilirubin Urine: NEGATIVE
Glucose, UA: NEGATIVE mg/dL
Hgb urine dipstick: NEGATIVE
Ketones, ur: 5 mg/dL — AB
Leukocytes,Ua: NEGATIVE
Nitrite: NEGATIVE
Protein, ur: NEGATIVE mg/dL
Specific Gravity, Urine: 1.02 (ref 1.005–1.030)
pH: 6 (ref 5.0–8.0)

## 2020-03-30 LAB — WET PREP, GENITAL
Clue Cells Wet Prep HPF POC: NONE SEEN
Sperm: NONE SEEN
Trich, Wet Prep: NONE SEEN
WBC, Wet Prep HPF POC: NONE SEEN
Yeast Wet Prep HPF POC: NONE SEEN

## 2020-03-30 LAB — HCG, QUANTITATIVE, PREGNANCY: hCG, Beta Chain, Quant, S: 16990 m[IU]/mL — ABNORMAL HIGH (ref <5)

## 2020-03-30 LAB — I-STAT BETA HCG BLOOD, ED (MC, WL, AP ONLY): I-stat hCG, quantitative: >2000 m[IU]/mL — ABNORMAL HIGH (ref <5)

## 2020-03-30 MED ORDER — ACETAMINOPHEN 325 MG PO TABS
650.0000 mg | ORAL_TABLET | Freq: Once | ORAL | Status: AC
  Administered 2020-03-30: 650 mg via ORAL
  Filled 2020-03-30: qty 2

## 2020-03-30 NOTE — ED Notes (Signed)
ED Provider at bedside. 

## 2020-03-30 NOTE — ED Notes (Signed)
Pt asking to leave AMA, MD aware and spoke with pt. Pt did not want to wait on paperwork or lab results.

## 2020-03-30 NOTE — ED Triage Notes (Signed)
Pt complains of stomach pains that feel sharp, they come and go. States she had fibroid surgery a few months ago. Pt states she took a pregnancy test Wednesday and it came back positive. First day of last period Oct 23

## 2020-03-30 NOTE — ED Provider Notes (Signed)
[facility]-EMERGENCY DEPT Provider Note   CSN: [id number] Arrival date & time: 03/30/20  0831     History No chief complaint on file.   Diana Townsend is a 39 y.o. female with a past medical history significant for anxiety, depression, hypertension, PTSD, and vertigo who presents to the ED due to intermittent, bilateral lower abdominal pain that has been ongoing for the past 10 years.  Patient describes pain as a sharp shooting pain.  Pain is worse during intercourse.  Patient notes she had a fibroid operation back in April of last year.  She also notes she took a pregnancy test on Wednesday which was positive.  Denies vaginal bleeding.  Denies any urinary or vaginal symptoms.  Last menstrual cycle was October 23.  Denies fever and chills.  Denies nausea, vomiting, diarrhea.  She has had previous fibroid surgery and cholecystectomy.  History obtained from patient and past medical records. No interpreter used during encounter.      Past Medical History:  Diagnosis Date  . Anxiety   . Depression   . Low blood pressure   . PTSD (post-traumatic stress disorder)   . Vertigo     There are no problems to display for this patient.   Past Surgical History:  Procedure Laterality Date  . CARPAL TUNNEL RELEASE Bilateral   . CHOLECYSTECTOMY    . UTERINE FIBROID SURGERY       OB History   No obstetric history on file.     Family History  Problem Relation Age of Onset  . Diabetes Mother   . Heart disease Mother   . COPD Father   . Diabetes Father   . Kidney disease Father   . Hypertension Father   . Heart failure Father   . Seizures Father   . Seizures Sister   . Anemia Sister   . Diabetes Maternal Aunt   . Heart disease Maternal Grandfather   . Heart disease Paternal Grandmother   . Breast cancer Cousin 40       mat cousin  . Colon cancer Neg Hx   . Stomach cancer Neg Hx   . Esophageal cancer Neg Hx   . Pancreatic cancer Neg Hx     Social  History   Tobacco Use  . Smoking status: Light Tobacco Smoker    Types: Cigars  . Smokeless tobacco: Never Used  . Tobacco comment: Black & Milds  Vaping Use  . Vaping Use: Never used  Substance Use Topics  . Alcohol use: Not Currently  . Drug use: Never    Home Medications Prior to Admission medications   Medication Sig Start Date End Date Taking? Authorizing Provider  fluconazole (DIFLUCAN) 150 MG tablet Take 1 tablet (150 mg total) by mouth daily. Take second dose 72 hours later if symptoms still persists. 02/22/20   Cathie Hoops, Amy V, PA-C  dicyclomine (BENTYL) 10 MG capsule Take 1 capsule (10 mg total) by mouth 2 (two) times daily as needed for spasms. 10/19/18 02/01/19  Sherrilyn Rist, MD  loratadine (CLARITIN) 10 MG tablet Take 1 tablet (10 mg total) by mouth daily. 11/24/19 02/22/20  Placido Sou, PA-C    Allergies    Latex  Review of Systems   Review of Systems  Constitutional: Negative for chills and fever.  Gastrointestinal: Positive for abdominal pain. Negative for diarrhea, nausea and vomiting.  Genitourinary: Negative for dysuria, vaginal bleeding and vaginal discharge.  All other systems reviewed and are negative.   Physical  Exam Updated Vital Signs BP 113/82 (BP Location: Left Arm)   Pulse 80   Temp 98.6 F (37 C) (Oral)   Resp 16   Ht 5\' 4"  (1.626 m)   SpO2 100%   BMI 27.46 kg/m   Physical Exam Vitals and nursing note reviewed. Exam conducted with a chaperone present.  Constitutional:      General: She is not in acute distress.    Appearance: She is not ill-appearing.  HENT:     Head: Normocephalic.  Eyes:     Pupils: Pupils are equal, round, and reactive to light.  Cardiovascular:     Rate and Rhythm: Normal rate and regular rhythm.     Pulses: Normal pulses.     Heart sounds: Normal heart sounds. No murmur heard.  No friction rub. No gallop.   Pulmonary:     Effort: Pulmonary effort is normal.     Breath sounds: Normal breath sounds.    Abdominal:     General: Abdomen is flat. Bowel sounds are normal. There is no distension.     Palpations: Abdomen is soft.     Tenderness: There is no abdominal tenderness. There is no guarding or rebound.     Comments: Abdomen soft, nondistended, nontender to palpation in all quadrants without guarding or peritoneal signs. No rebound.   Genitourinary:    Vagina: Normal.     Cervix: No cervical motion tenderness.     Comments: No CMT.  No adnexal tenderness or masses.  Mild white discharge. Musculoskeletal:     Cervical back: Neck supple.     Comments: Able to move all 4 extremities without difficulty.   Skin:    General: Skin is warm and dry.  Neurological:     General: No focal deficit present.     Mental Status: She is alert.  Psychiatric:        Mood and Affect: Mood normal.        Behavior: Behavior normal.     ED Results / Procedures / Treatments   Labs (all labs ordered are listed, but only abnormal results are displayed) Labs Reviewed  HCG, QUANTITATIVE, PREGNANCY - Abnormal; Notable for the following components:      Result Value   hCG, Beta Chain, Quant, S 16,990 (*)    All other components within normal limits  URINALYSIS, ROUTINE W REFLEX MICROSCOPIC - Abnormal; Notable for the following components:   Ketones, ur 5 (*)    All other components within normal limits  I-STAT BETA HCG BLOOD, ED ([facility], [facility], [facility] ONLY) - Abnormal; Notable for the following components:   I-stat hCG, quantitative >2,000.0 (*)    All other components within normal limits  WET PREP, GENITAL  CBC WITH DIFFERENTIAL/PLATELET  COMPREHENSIVE METABOLIC PANEL  LIPASE, BLOOD  GC/CHLAMYDIA PROBE AMP ([facility] [facility]) NOT AT [facility]    EKG None  Radiology No results found.  Procedures Procedures (including critical care time)  Medications Ordered in ED Medications  acetaminophen (TYLENOL) tablet 650 mg (650 mg Oral Given 03/30/20 0950)    ED Course  I have reviewed the triage vital signs and  the nursing notes.  Pertinent labs & imaging results that were available during my care of the patient were reviewed by me and considered in my medical decision making (see chart for details).  Clinical Course as of Mar 30 1145  Sat Mar 30, 2020  1001 HCG, Beta Chain, Quant, S(!): 16,990 [CA]  1049 Ketones, ur(!): 5 [CA]  Clinical Course User Index [CA] Jesusita Oka   MDM Rules/Calculators/A&P                         39 year old female presents to the ED due to bilateral lower abdominal pain that has been intermittent for the past 10 years.  Worse with intercourse.  Had fibroid surgery back in April with no improvement in symptoms.  Patient also took a home pregnancy test on Wednesday which was positive.  Last menstrual cycle October 23.  Patient denies any vaginal bleeding.  Upon arrival, vitals all within normal limits.  Patient in no acute distress and non-ill-appearing.  Physical exam reassuring.  Abdomen soft, nondistended, nontender.  Negative CVA tenderness bilaterally.  Will obtain UA and pregnancy test.  We will also perform pelvic exam.  Patient requesting STD testing.  Routine labs ordered given abdominal pain.  Pelvic exam performed with chaperone in room.  No CMT.  Doubt PID.  No adnexal tenderness or masses.  Mild white discharge.  Chlamydia, gonorrhea, and wet prep swabs taken. hcg 16,000. UA negative for signs of infection.  11:40 AM Informed by RN that patient is ready to leave. Discussed with patient I do not have any labs back yet, but she notes she is ready to go home. Advised patient to call her OBGYN on Monday to schedule an appointment for further evaluation and ultrasound to rule out ectopic pregnancy.  Given patient's abdominal pain has been ongoing for the past 10 years, low suspicion for ectopic pregnancy however, informed patient I am unable to rule it out and she requires further testing from her OBGYN. I have discussed my concerns as her provider and the  possibility that this may worsen. I have specifically discussed that without further evaluation I cannot guarantee there is not a life threatening event occuring.  Pt is A&Ox4, his own POA and states understanding of my concerns and the possible consequences.  I have made pt aware that this is an AMA discharge, but he may return at any time for further evaluation and treatment.   Discussed case with Dr. Estell Harpin who agrees with assessment and plan.  Final Clinical Impression(s) / ED Diagnoses Final diagnoses:  Lower abdominal pain  Less than [redacted] weeks gestation of pregnancy    Rx / DC Orders ED Discharge Orders    None       Jesusita Oka 03/30/20 1147    Bethann Berkshire, MD 03/31/20 [phone]

## 2020-03-30 NOTE — Discharge Instructions (Signed)
As discussed, I have no received any of your labs back. I am unable to rule out any emergent. Please call your OBGYN on Monday to schedule an appointment for further evaluation. Return to the ER for new or worsening symptoms.

## 2020-04-23 DIAGNOSIS — Z9189 Other specified personal risk factors, not elsewhere classified: Secondary | ICD-10-CM | POA: Diagnosis not present

## 2020-04-23 DIAGNOSIS — R252 Cramp and spasm: Secondary | ICD-10-CM | POA: Diagnosis not present

## 2020-04-23 DIAGNOSIS — Z9889 Other specified postprocedural states: Secondary | ICD-10-CM | POA: Diagnosis not present

## 2020-04-23 DIAGNOSIS — M549 Dorsalgia, unspecified: Secondary | ICD-10-CM | POA: Diagnosis not present

## 2020-05-01 DIAGNOSIS — Z9889 Other specified postprocedural states: Secondary | ICD-10-CM | POA: Diagnosis not present

## 2020-05-16 DIAGNOSIS — Z5989 Other problems related to housing and economic circumstances: Secondary | ICD-10-CM | POA: Diagnosis not present

## 2020-05-16 DIAGNOSIS — Z0000 Encounter for general adult medical examination without abnormal findings: Secondary | ICD-10-CM | POA: Diagnosis not present

## 2020-05-16 DIAGNOSIS — R42 Dizziness and giddiness: Secondary | ICD-10-CM | POA: Diagnosis not present

## 2020-05-28 ENCOUNTER — Telehealth: Payer: Self-pay | Admitting: Family Medicine

## 2020-05-28 ENCOUNTER — Telehealth: Payer: Self-pay | Admitting: Obstetrics & Gynecology

## 2020-05-28 NOTE — Telephone Encounter (Signed)
I called pt to sch appt from referral, no answer no voicemail . Second attempt,

## 2020-05-28 NOTE — Telephone Encounter (Signed)
Attempted to reach patient about scheduling for an appointment. Was not able to reach her.

## 2020-05-29 DIAGNOSIS — Z9889 Other specified postprocedural states: Secondary | ICD-10-CM | POA: Diagnosis not present

## 2020-06-27 DIAGNOSIS — E611 Iron deficiency: Secondary | ICD-10-CM | POA: Diagnosis not present

## 2020-07-02 DIAGNOSIS — Z9889 Other specified postprocedural states: Secondary | ICD-10-CM | POA: Diagnosis not present

## 2020-07-25 DIAGNOSIS — E611 Iron deficiency: Secondary | ICD-10-CM | POA: Diagnosis not present

## 2020-09-05 DIAGNOSIS — E611 Iron deficiency: Secondary | ICD-10-CM | POA: Diagnosis not present

## 2020-09-19 DIAGNOSIS — E611 Iron deficiency: Secondary | ICD-10-CM | POA: Diagnosis not present

## 2020-09-26 DIAGNOSIS — E611 Iron deficiency: Secondary | ICD-10-CM | POA: Diagnosis not present

## 2020-10-10 DIAGNOSIS — D509 Iron deficiency anemia, unspecified: Secondary | ICD-10-CM | POA: Diagnosis not present

## 2020-10-17 DIAGNOSIS — E611 Iron deficiency: Secondary | ICD-10-CM | POA: Diagnosis not present

## 2020-10-23 DIAGNOSIS — K660 Peritoneal adhesions (postprocedural) (postinfection): Secondary | ICD-10-CM | POA: Diagnosis not present

## 2020-10-23 DIAGNOSIS — D509 Iron deficiency anemia, unspecified: Secondary | ICD-10-CM | POA: Diagnosis not present

## 2020-11-18 DIAGNOSIS — Z86018 Personal history of other benign neoplasm: Secondary | ICD-10-CM | POA: Diagnosis not present

## 2020-11-18 DIAGNOSIS — D509 Iron deficiency anemia, unspecified: Secondary | ICD-10-CM | POA: Diagnosis not present

## 2021-02-07 ENCOUNTER — Other Ambulatory Visit: Payer: Self-pay | Admitting: Nurse Practitioner

## 2021-02-07 DIAGNOSIS — N6489 Other specified disorders of breast: Secondary | ICD-10-CM

## 2022-02-23 ENCOUNTER — Other Ambulatory Visit: Payer: Self-pay | Admitting: Nurse Practitioner

## 2022-02-23 DIAGNOSIS — N6489 Other specified disorders of breast: Secondary | ICD-10-CM

## 2022-02-27 ENCOUNTER — Other Ambulatory Visit: Payer: Self-pay | Admitting: Nurse Practitioner

## 2022-03-29 IMAGING — MG DIGITAL DIAGNOSTIC BILAT W/ TOMO W/ CAD
8 series · 8 of 24 positions shown · non-contrast
Comparison: Previous exam(s).

CLINICAL DATA: Follow-up probably benign asymmetry seen in the
upper, central left breast on a baseline screening mammogram dated
04/19/2019 with no ultrasound correlate.

EXAM:
DIGITAL DIAGNOSTIC BILATERAL MAMMOGRAM WITH TOMO AND CAD

[L CC synth-2D]
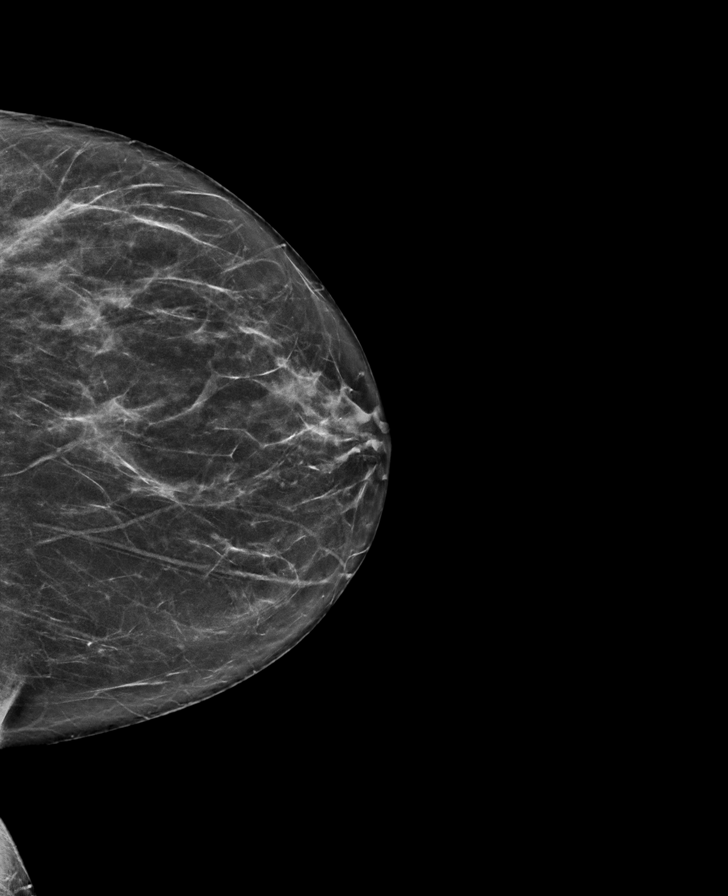

[R CC synth-2D]
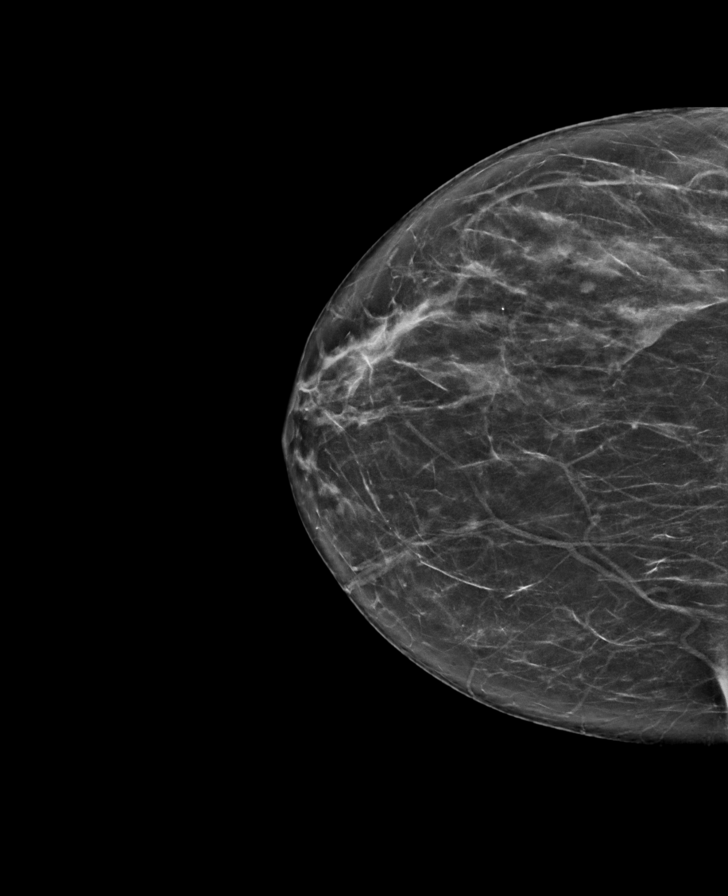

[L MLO synth-2D]
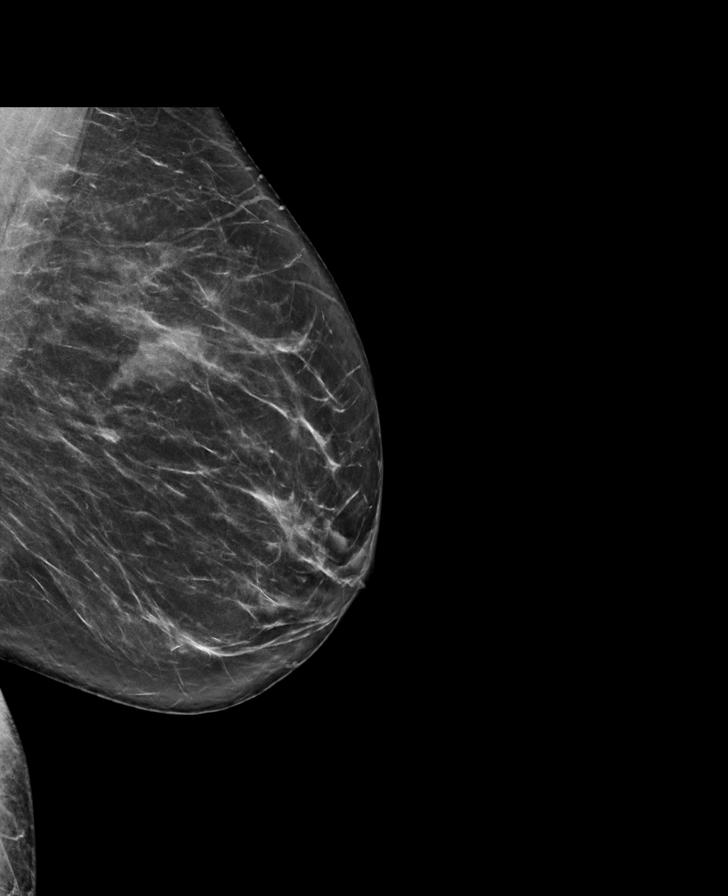

[R MLO synth-2D]
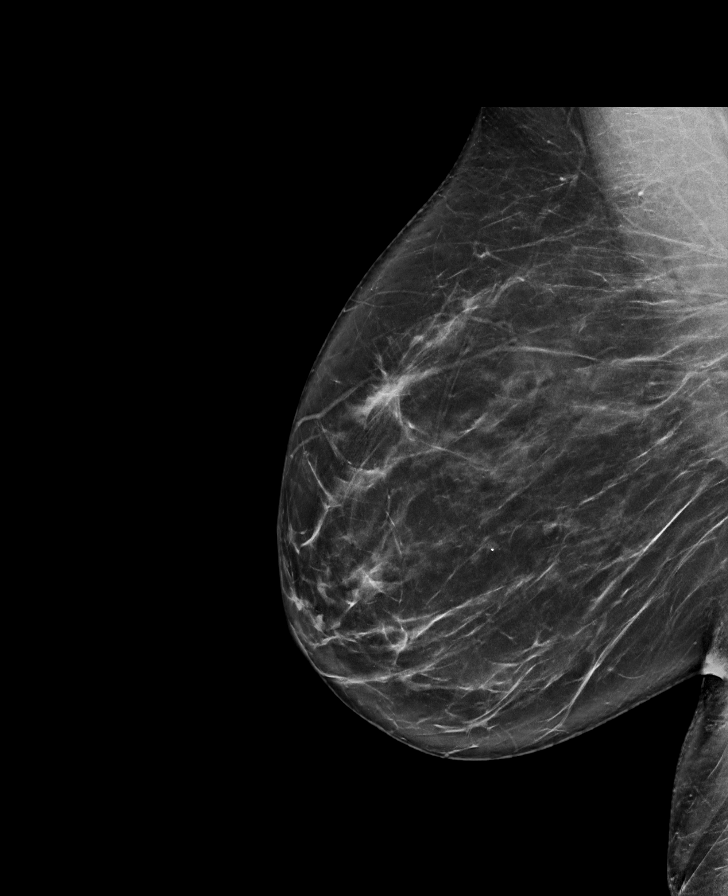

[R MLO tomo · tomo slice 41/80.0]
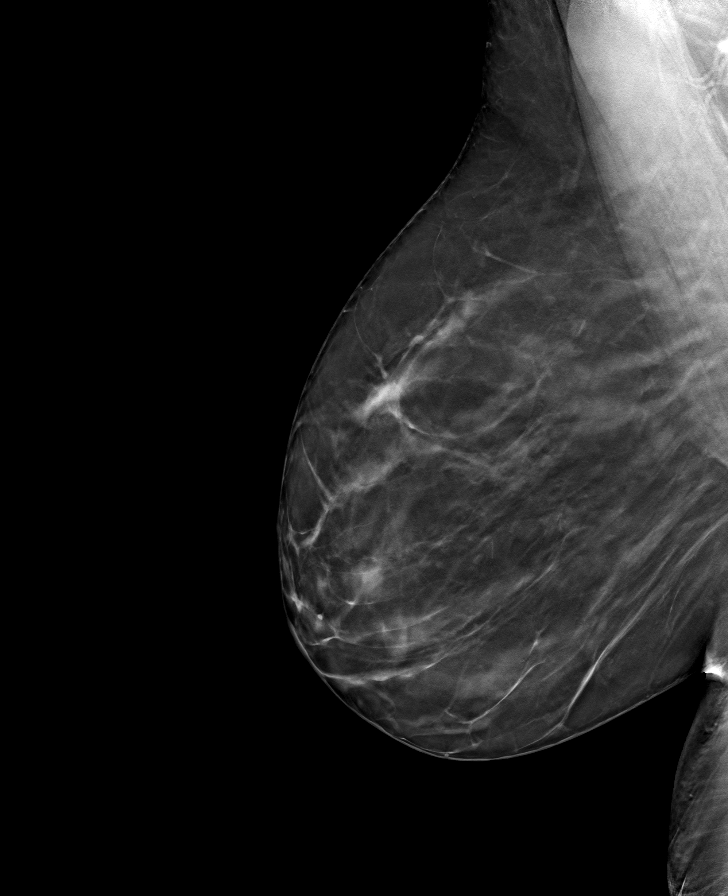

[R CC tomo · tomo slice 35/68.0]
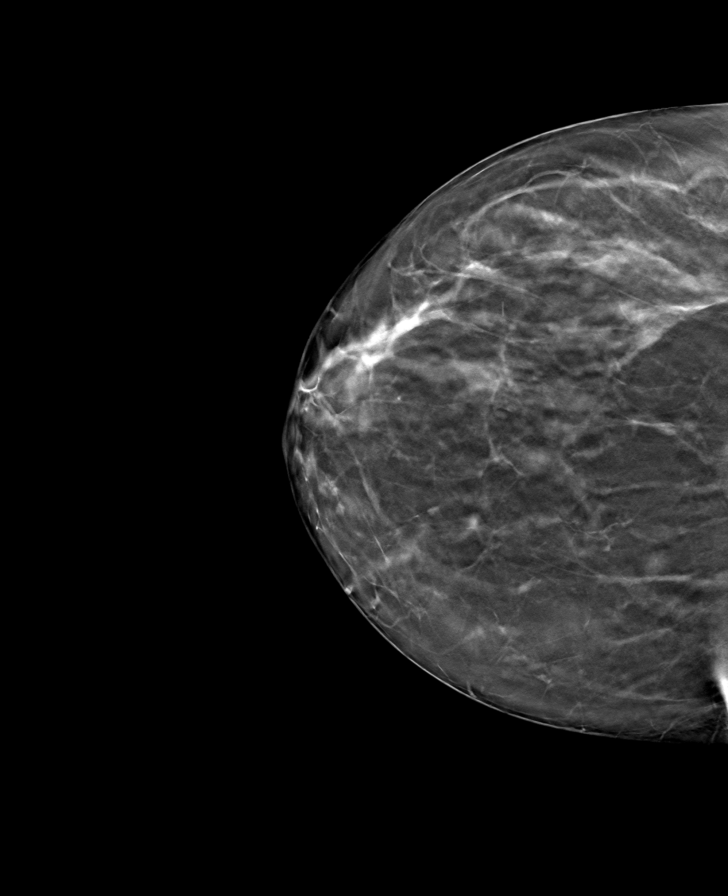

[L MLO tomo · tomo slice 37/74.0]
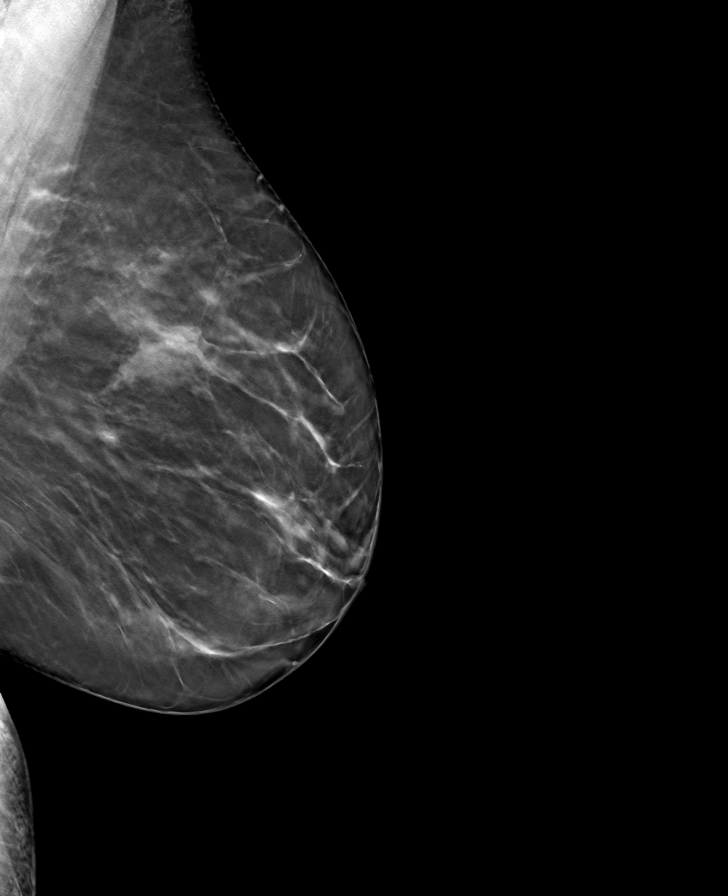

[L CC tomo · tomo slice 34/67.0]
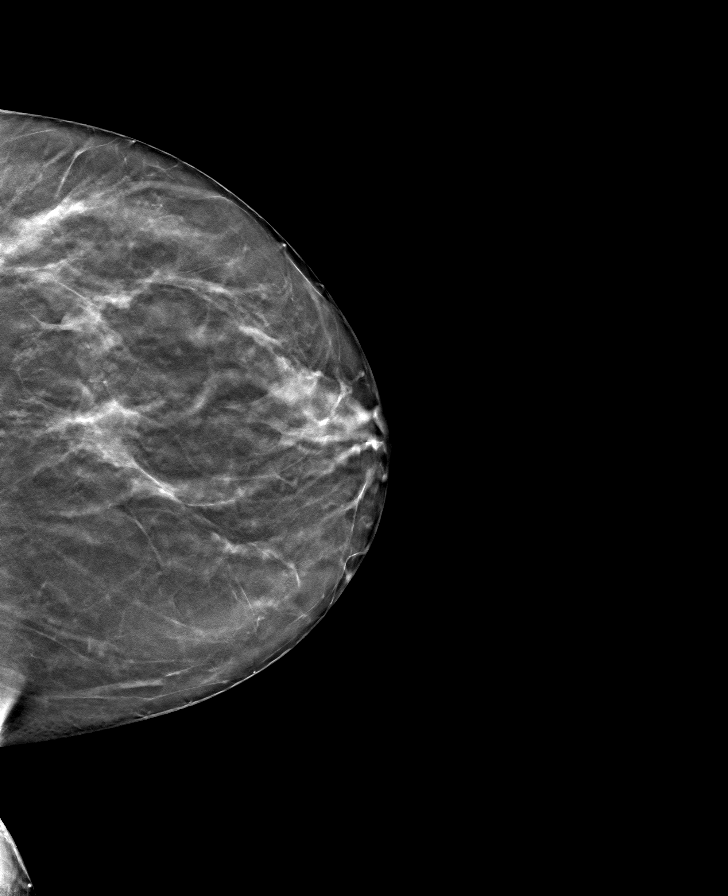

[8 of 24 positions shown; findings below may reference images not displayed]

ACR Breast Density Category b: There are scattered areas of
fibroglandular density.
FINDINGS: A previously demonstrated cyst in the lateral right breast is
significantly smaller. The previously described probably benign
focal asymmetry in the upper, central left breast is unchanged since
10/07/2018. No interval findings suspicious for malignancy in either
breast.

Mammographic images were processed with CAD.
IMPRESSION: 1. Stable left breast probably benign focal asymmetry, most likely
representing an island of normal fibroglandular tissue.
2. No evidence of malignancy elsewhere in either breast.

RECOMMENDATION:
Bilateral diagnostic mammogram in 1 year to complete 2 years of
follow-up of the probably benign asymmetry on the left.

I have discussed the findings and recommendations with the patient.
If applicable, a reminder letter will be sent to the patient
regarding the next appointment.

BI-RADS CATEGORY  3: Probably benign.
